# Patient Record
Sex: Male | Born: 1975 | Race: White | Hispanic: No | Marital: Married | Smoking: Never smoker
Health system: Southern US, Community
[De-identification: ages and names within clinical notes are randomized; demographics above are authoritative.]

## PROBLEM LIST (undated history)

## (undated) DIAGNOSIS — E039 Hypothyroidism, unspecified: Secondary | ICD-10-CM

## (undated) DIAGNOSIS — I10 Essential (primary) hypertension: Secondary | ICD-10-CM

## (undated) DIAGNOSIS — A4902 Methicillin resistant Staphylococcus aureus infection, unspecified site: Secondary | ICD-10-CM

## (undated) DIAGNOSIS — F319 Bipolar disorder, unspecified: Secondary | ICD-10-CM

## (undated) DIAGNOSIS — E785 Hyperlipidemia, unspecified: Secondary | ICD-10-CM

## (undated) DIAGNOSIS — K219 Gastro-esophageal reflux disease without esophagitis: Secondary | ICD-10-CM

## (undated) DIAGNOSIS — G43909 Migraine, unspecified, not intractable, without status migrainosus: Secondary | ICD-10-CM

## (undated) HISTORY — DX: Hyperlipidemia, unspecified: E78.5

## (undated) HISTORY — PX: ROTATOR CUFF REPAIR: SHX139

## (undated) HISTORY — DX: Essential (primary) hypertension: I10

## (undated) HISTORY — DX: Hypothyroidism, unspecified: E03.9

## (undated) HISTORY — DX: Gastro-esophageal reflux disease without esophagitis: K21.9

## (undated) HISTORY — PX: SEPTOPLASTY: SUR1290

## (undated) HISTORY — DX: Migraine, unspecified, not intractable, without status migrainosus: G43.909

## (undated) HISTORY — DX: Bipolar disorder, unspecified: F31.9

---

## 1999-10-12 ENCOUNTER — Emergency Department (HOSPITAL_COMMUNITY): Admission: EM | Admit: 1999-10-12 | Discharge: 1999-10-12 | Payer: Self-pay | Admitting: Emergency Medicine

## 2014-10-16 ENCOUNTER — Encounter (INDEPENDENT_AMBULATORY_CARE_PROVIDER_SITE_OTHER): Payer: Self-pay

## 2014-10-16 ENCOUNTER — Encounter: Payer: Self-pay | Admitting: Family Medicine

## 2014-10-16 ENCOUNTER — Ambulatory Visit (INDEPENDENT_AMBULATORY_CARE_PROVIDER_SITE_OTHER): Payer: BC Managed Care – PPO | Admitting: Family Medicine

## 2014-10-16 VITALS — BP 110/70 | HR 70 | Temp 97.9°F | Ht 73.0 in | Wt 244.1 lb

## 2014-10-16 DIAGNOSIS — I10 Essential (primary) hypertension: Secondary | ICD-10-CM

## 2014-10-16 DIAGNOSIS — Z8619 Personal history of other infectious and parasitic diseases: Secondary | ICD-10-CM | POA: Diagnosis not present

## 2014-10-16 DIAGNOSIS — E039 Hypothyroidism, unspecified: Secondary | ICD-10-CM | POA: Diagnosis not present

## 2014-10-16 DIAGNOSIS — Z0000 Encounter for general adult medical examination without abnormal findings: Secondary | ICD-10-CM | POA: Diagnosis not present

## 2014-10-16 DIAGNOSIS — F99 Mental disorder, not otherwise specified: Secondary | ICD-10-CM

## 2014-10-16 NOTE — Patient Instructions (Signed)
It was nice to see you today.  Please let me know if you need any refills on your medications.  Follow up in 6 months or sooner if needed.  Take care  Dr. Adriana Simas

## 2014-10-16 NOTE — Progress Notes (Signed)
Pre visit review using our clinic review tool, if applicable. No additional management support is needed unless otherwise documented below in the visit note. 

## 2014-10-18 ENCOUNTER — Encounter: Payer: Self-pay | Admitting: Family Medicine

## 2014-10-18 DIAGNOSIS — F99 Mental disorder, not otherwise specified: Secondary | ICD-10-CM | POA: Insufficient documentation

## 2014-10-18 DIAGNOSIS — Z8619 Personal history of other infectious and parasitic diseases: Secondary | ICD-10-CM | POA: Insufficient documentation

## 2014-10-18 DIAGNOSIS — Z0000 Encounter for general adult medical examination without abnormal findings: Secondary | ICD-10-CM | POA: Insufficient documentation

## 2014-10-18 DIAGNOSIS — E039 Hypothyroidism, unspecified: Secondary | ICD-10-CM | POA: Insufficient documentation

## 2014-10-18 DIAGNOSIS — I10 Essential (primary) hypertension: Secondary | ICD-10-CM | POA: Insufficient documentation

## 2014-10-18 NOTE — Progress Notes (Signed)
Subjective:  Patient ID: Matthew Griffin, male    DOB: May 17, 1975  Age: 39 y.o. MRN: [id number]  CC: Establish Care   HPI Matthew Griffin is a 39 year old male who presents to establish care. Concerns/issues below.   1) Preventative Healthcare  Colonoscopy: 2011  Immunizations: UTD  Labs: Patient reports he has had recent labs.  STD/HIV testing: Patient reports HIV screening has been done (cannot locate in chart).   2) Recurrent Mononucleosis  Patient reports he had mononucleosis in 2015.  He states that since that time he has had recurrent bouts of fatigue and diffuse bodyaches. He attributes these symptoms to recurrence of EBV.  He states that he currently feels like he is having a recurrence.  He states he has seen an ID physician regarding this.  3) Thrush  Patient reports that after being diagnosed with mono he developed thrush which she says was in his esophagus.  He underwent endoscopy and was treated accordingly with anti-fungus.  Patient currently feels like he has something in his throat.   He currently feels like he is having a recurrence of thrush.  No exacerbating or relieving factors.   4) Bipolar disorder, ? Schizophrenia  Patient states that he is bipolar.  He is followed by psychiatry and is currently on lithium and Klonopin.  Patient's review of systems was notable for significant psychiatric issues including hallucinations and suicidal ideation.  He has one refill he is doing okay at this time on lithium.  I asked him about suicidal ideation and he states that he has never had a plan. He just felt like he would be better off deceased.  I also asked him about hallucinations and he confirms that he hears voices.  He states that the diagnosis of schizophrenia has been discussed but not validated.  PMH, Surgical Hx, Family Hx, Social History reviewed and updated as below. Past Medical History  Diagnosis Date  . Bipolar disorder   . HTN  (hypertension)   . HLD (hyperlipidemia)   . Migraines   . Hypothyroidism   . GERD (gastroesophageal reflux disease)     Past Surgical History  Procedure Laterality Date  . Septoplasty      Family History  Problem Relation Age of Onset  . Heart disease Father   . Heart disease Paternal Grandmother   . Heart disease Paternal Grandfather   . Stroke Maternal Grandmother   . Hypertension Father   . Hypertension Paternal Grandfather   . Hypertension Paternal Grandmother   . Mental illness Paternal Grandmother     Social History  Substance Use Topics  . Smoking status: Never Smoker   . Smokeless tobacco: Never Used  . Alcohol Use: 0.6 - 1.2 oz/week    1-2 Standard drinks or equivalent per week    Review of Systems  Constitutional: Positive for fever, chills and fatigue.  HENT: Positive for sore throat.        Sinus pain.  Respiratory: Positive for shortness of breath.   Cardiovascular: Negative.   Gastrointestinal: Positive for nausea and abdominal pain.  Endocrine:       Increased thirst.  "Enlarged thyroid"  Genitourinary: Negative.   Musculoskeletal: Positive for arthralgias.  Skin: Negative.   Allergic/Immunologic: Positive for environmental allergies.  Neurological: Positive for dizziness and numbness.  Psychiatric/Behavioral: Positive for suicidal ideas, hallucinations and sleep disturbance.  ROS form scanned into chart.   Objective:   Today's Vitals: BP 110/70 mmHg  Pulse 70  Temp(Src) 97.9 F (36.6 C) (  Oral)  Ht  (1.854 m)  Wt 244 lb 2 oz (110.734 kg)  BMI 32.22 kg/m2  SpO2 97%  Physical Exam  Constitutional: He is oriented to person, place, and time. He appears well-developed and well-nourished. No distress.  HENT:  Head: Normocephalic and atraumatic.  Nose: Nose normal.  Mouth/Throat: Oropharynx is clear and moist. No oropharyngeal exudate.  Normal TM's bilaterally.   Eyes: Conjunctivae are normal. No scleral icterus.  Neck: Neck supple. No  thyromegaly present.  Cardiovascular: Normal rate and regular rhythm.   No murmur heard. Pulmonary/Chest: Effort normal and breath sounds normal. He has no wheezes. He has no rales.  Abdominal: Soft. He exhibits no distension. There is no tenderness. There is no rebound and no guarding.  Musculoskeletal: Normal range of motion. He exhibits no edema.  Lymphadenopathy:    He has no cervical adenopathy.  Neurological: He is alert and oriented to person, place, and time.  Skin: Skin is warm and dry. No rash noted.  Psychiatric:  Depressed mood and affect.  Vitals reviewed.    Assessment & Plan:   Problem List Items Addressed This Visit    None      Outpatient Encounter Prescriptions as of 10/16/2014  Medication Sig  . cholecalciferol (VITAMIN D) 400 UNITS TABS tablet Take 400 Units by mouth.  . clonazePAM (KLONOPIN) 0.5 MG tablet Take 0.5 mg by mouth 2 (two) times daily as needed for anxiety.  Marland Kitchen esomeprazole (NEXIUM) 20 MG packet Take 22.3 mg by mouth daily before breakfast.  . fluticasone (FLONASE) 50 MCG/ACT nasal spray Place 2 sprays into both nostrils 2 (two) times daily.  Boris Lown Oil (HM MEGAKRILL) 300 MG CAPS Take 500 mg by mouth.  . levothyroxine (SYNTHROID, LEVOTHROID) 75 MCG tablet Take 75 mcg by mouth daily before breakfast.  . LITHIUM CARBONATE ER PO Take 300 mg by mouth.  . metoprolol succinate (TOPROL-XL) 25 MG 24 hr tablet Take 25 mg by mouth daily.  . Multiple Vitamin (MULTIVITAMIN) capsule Take 1 capsule by mouth daily.  . vitamin B-12 (CYANOCOBALAMIN) 1000 MCG tablet Take 3,000 mcg by mouth daily.   No facility-administered encounter medications on file as of 10/16/2014.    Follow-up: Return in about 6 months (around 04/18/2015).    Tommie Sams DO

## 2014-10-18 NOTE — Assessment & Plan Note (Addendum)
The diagnosis is unclear to me at this time. I favor the diagnosis of schizophrenia as patient is having of auditory hallucinations.  Review of systems was markedly abnormal especially from a psychiatric perspective. I suspect this is all secondary to his underlying mental illness. Patient is currently on lithium and Klonopin. I encouraged him to follow-up closely with his psychiatrist.

## 2014-10-18 NOTE — Assessment & Plan Note (Signed)
I reviewed the electronic medical record and can find no record of patient having esophagitis. Patient has had oral thrush and this was confirmed in the chart. Additionally, I cannot find any record of HIV testing. Will await medical records.

## 2014-10-18 NOTE — Assessment & Plan Note (Signed)
Patient is convinced that he has recurrent mononucleosis. I have reviewed electronic medical record and patient has seen infectious disease for this.  Last office visit from his ID physician revealed that there was no evidence of recurrence. Patient symptoms of fatigue and diffuse body aches as well as his other somatic complaints are likely from underlying mental illness.

## 2014-10-18 NOTE — Assessment & Plan Note (Signed)
Well controlled on Metoprolol

## 2014-10-18 NOTE — Assessment & Plan Note (Signed)
Preventative healthcare up to date excluding labs. Will await records.

## 2014-12-03 ENCOUNTER — Ambulatory Visit (INDEPENDENT_AMBULATORY_CARE_PROVIDER_SITE_OTHER): Payer: BC Managed Care – PPO | Admitting: Family Medicine

## 2014-12-03 ENCOUNTER — Encounter: Payer: Self-pay | Admitting: Family Medicine

## 2014-12-03 VITALS — BP 124/86 | HR 60 | Temp 98.0°F | Ht 73.0 in | Wt 233.0 lb

## 2014-12-03 DIAGNOSIS — M25512 Pain in left shoulder: Secondary | ICD-10-CM | POA: Diagnosis not present

## 2014-12-03 DIAGNOSIS — R5382 Chronic fatigue, unspecified: Secondary | ICD-10-CM

## 2014-12-03 DIAGNOSIS — M75102 Unspecified rotator cuff tear or rupture of left shoulder, not specified as traumatic: Secondary | ICD-10-CM | POA: Diagnosis not present

## 2014-12-03 MED ORDER — METHYLPREDNISOLONE ACETATE 40 MG/ML IJ SUSP: 40.0000 mg | Freq: Once | INTRAMUSCULAR | Status: DC

## 2014-12-03 NOTE — Patient Instructions (Signed)
It was nice to see you today.  Be sure to move that shoulder as we discussed.  We will call regarding your labs.  Take care  Dr. Adriana Simas

## 2014-12-03 NOTE — Progress Notes (Signed)
Pre visit review using our clinic review tool, if applicable. No additional management support is needed unless otherwise documented below in the visit note. 

## 2014-12-04 ENCOUNTER — Ambulatory Visit: Payer: BC Managed Care – PPO | Admitting: Family Medicine

## 2014-12-04 DIAGNOSIS — R5382 Chronic fatigue, unspecified: Secondary | ICD-10-CM | POA: Insufficient documentation

## 2014-12-04 DIAGNOSIS — M25512 Pain in left shoulder: Secondary | ICD-10-CM | POA: Insufficient documentation

## 2014-12-04 LAB — EPSTEIN-BARR VIRUS VCA, IGM

## 2014-12-04 NOTE — Progress Notes (Signed)
Subjective:  Patient ID: Matthew Griffin, male    DOB: [birth year]  Age: 39 y.o. MRN: [id number]  CC: Left shoulder pain, Fatigue  HPI: 39 year old male with ?bipolar disorder vs schizophrenia presents to the clinic today with the above complaints.  1) Left shoulder pain  Started 3-4 weeks ago following vacation where he went tubing.  He reports that he was "jerked around" and subsequently developed left shoulder pain.  Pain is located laterally.  He has been expressing decreased range of motion particularly with flexion and abduction.  He's used topical treatment as well as Advil with minimal improvement.  Pain is exacerbated with range of motion.  2) Fatigue  Patient reports for the past few weeks to months has been experiencing significant fatigue.  He states that he feels as if he has recurrence of his mononucleosis.  No associated fevers or chills. No recent illness.  He has had recent medication changes by his psychiatrist (lithium was increased and Latuda was started).  Social Hx   Social History   Social History  . Marital Status: Single    Spouse Name: N/A  . Number of Children: N/A  . Years of Education: N/A   Social History Main Topics  . Smoking status: Never Smoker   . Smokeless tobacco: Never Used  . Alcohol Use: 0.6 - 1.2 oz/week    1-2 Standard drinks or equivalent per week  . Drug Use: No  . Sexual Activity:    Partners: Female   Other Topics Concern  . None   Social History Narrative   Review of Systems  Constitutional: Positive for fatigue.  Musculoskeletal:       Left shoulder pain.   Objective:  BP 124/86 mmHg  Pulse 60  Temp(Src) 98 F (36.7 C) (Oral)  Ht  (1.854 m)  Wt 233 lb (105.688 kg)  BMI 30.75 kg/m2  SpO2 98%  BP/Weight 12/03/2014 10/16/2014  Systolic BP 124 110  Diastolic BP 86 70  Wt. (Lbs) 233 244.13  BMI 30.75 32.22   Physical Exam  Constitutional: He appears well-developed and well-nourished.    Cardiovascular: Normal rate and regular rhythm.   No murmur heard. Pulmonary/Chest: Effort normal and breath sounds normal. No respiratory distress. He has no wheezes. He has no rales.  Musculoskeletal:  Shoulder: Left Inspection reveals no abnormalities, atrophy or asymmetry. Palpation is normal with no tenderness over AC joint or bicipital groove. ROM is decreased in flexion. Rotator cuff strength: Supraspinatus 4/5; all others 5/5. +Hawkins test. Painful arc.  Neurological: He is alert.  Psychiatric:  Depressed mood and flat affect.  Vitals reviewed.  Procedure: Subacromial bursa injection. Consent signed and scanned into record. Medication:  1 mL of Depo-Medrol and 4 mL's of lidocaine 1% without epi Preparation: area cleansed with alcohol x 3. Injection  Landmarks identified Above medication injected using a standard posterior approach. Patient tolerated well without bleeding or paresthesias   Assessment & Plan:   Problem List Items Addressed This Visit    Left shoulder pain    Appears to be secondary to rotator cuff injury/bursitis. Discussed therapy options today and patient elected for injection. Patient tolerated injection well. Advised wall exercise at home. Follow up PRN.      Relevant Medications   methylPREDNISolone acetate (DEPO-MEDROL) injection 40 mg   Chronic fatigue - Primary    Patient is perseverative about recurring mononucleosis. He has seen infectious disease and has had no signs of recurrence despite his belief that this is  the case. I informed him that I do not think he's had a recurrence as this is very atypical and he said prior labs which have not reflected this. Nevertheless, we will obtain labs to ensure no recurrence. I anticipate that his fatigue is secondary to stress, mental illness, and medications.       Relevant Orders   Epstein barr virus(EBV) by PCR   Epstein-Barr virus VCA, IgM    Other Visit Diagnoses    Rotator cuff  syndrome, left          Meds ordered this encounter  Medications  . B Complex Vitamins (VITAMIN B COMPLEX PO)    Sig: Take 1,000 mg by mouth.  . lurasidone (LATUDA) 80 MG TABS tablet    Sig: Take 80 mg by mouth daily with breakfast.  . methylPREDNISolone acetate (DEPO-MEDROL) injection 40 mg    Sig:     Follow-up: PRN  Everlene Other, DO

## 2014-12-04 NOTE — Assessment & Plan Note (Signed)
Appears to be secondary to rotator cuff injury/bursitis. Discussed therapy options today and patient elected for injection. Patient tolerated injection well. Advised wall exercise at home. Follow up PRN.

## 2014-12-04 NOTE — Assessment & Plan Note (Signed)
Patient is perseverative about recurring mononucleosis. He has seen infectious disease and has had no signs of recurrence despite his belief that this is the case. I informed him that I do not think he's had a recurrence as this is very atypical and he said prior labs which have not reflected this. Nevertheless, we will obtain labs to ensure no recurrence. I anticipate that his fatigue is secondary to stress, mental illness, and medications.

## 2014-12-08 LAB — EPSTEIN BARR VIRUS DNA, QUANT RTPCR

## 2014-12-15 ENCOUNTER — Other Ambulatory Visit: Payer: Self-pay | Admitting: Family Medicine

## 2015-01-09 ENCOUNTER — Other Ambulatory Visit: Payer: Self-pay | Admitting: Family Medicine

## 2015-01-11 ENCOUNTER — Other Ambulatory Visit: Payer: Self-pay

## 2015-01-11 NOTE — Telephone Encounter (Signed)
Patient is needing a 90 day refill on his Nexium 20 mg.

## 2015-01-14 ENCOUNTER — Telehealth: Payer: Self-pay | Admitting: Internal Medicine

## 2015-01-14 NOTE — Telephone Encounter (Signed)
Ok with me 

## 2015-01-14 NOTE — Telephone Encounter (Signed)
Transfer okay with me.

## 2015-01-14 NOTE — Telephone Encounter (Signed)
Pt request to transfer from Dr. Adriana Simasook to Dr. Lawerance BachBurns to be closer to work area. Please advise  Call Amber back [phone]

## 2015-01-15 NOTE — Telephone Encounter (Signed)
Left vm for pt to call and schedule an appt with Dr. Lawerance BachBurns.

## 2015-02-16 ENCOUNTER — Ambulatory Visit: Payer: BC Managed Care – PPO | Admitting: Internal Medicine

## 2015-02-19 ENCOUNTER — Ambulatory Visit (INDEPENDENT_AMBULATORY_CARE_PROVIDER_SITE_OTHER): Payer: BC Managed Care – PPO | Admitting: Internal Medicine

## 2015-02-19 ENCOUNTER — Encounter: Payer: Self-pay | Admitting: Internal Medicine

## 2015-02-19 VITALS — BP 110/82 | HR 65 | Temp 98.1°F | Resp 16 | Wt 239.0 lb

## 2015-02-19 DIAGNOSIS — E039 Hypothyroidism, unspecified: Secondary | ICD-10-CM

## 2015-02-19 DIAGNOSIS — I10 Essential (primary) hypertension: Secondary | ICD-10-CM

## 2015-02-19 DIAGNOSIS — M25512 Pain in left shoulder: Secondary | ICD-10-CM | POA: Diagnosis not present

## 2015-02-19 DIAGNOSIS — F99 Mental disorder, not otherwise specified: Secondary | ICD-10-CM | POA: Diagnosis not present

## 2015-02-19 NOTE — Assessment & Plan Note (Signed)
Refer to orthopedics 

## 2015-02-19 NOTE — Progress Notes (Signed)
Pre visit review using our clinic review tool, if applicable. No additional management support is needed unless otherwise documented below in the visit note. 

## 2015-02-19 NOTE — Assessment & Plan Note (Signed)
?   Bipolar or schizophrenia Following with psychiatry He was in favor of me prescribing his medication, I told him I would not feel comfortable doing that and he would need to continue to see a psychiatrist He may decide to switch to a psychiatrist closer to where he lives-he will let me know if he needs a referral

## 2015-02-19 NOTE — Progress Notes (Signed)
Subjective:    Patient ID: Matthew Griffin, male    DOB: September 05, 1975, 39 y.o.   MRN: [id number]  HPI He is here to establish with a new pcp.   Left shoulder pain: For over a month he has been experiencing left shoulder pain. He saw his previous primary care physician and he thought it may have been a nerve problem  Per the patient. He was given an injection, but it did not help. He is doing the stretching exercises as advised, but there still has been no improvement. He would like to see an orthopedic. He states the injury occurred when he was tubing-being pulled by a boat and his arm was tugged hardly. He has difficulty putting weight on his arm and it is difficult for him to raise his arm above his head.   Hypothyroidism: He is following with a psychiatrist and she has been monitoring His thyroid function and prescribing his medication. He states he just had blood work done this morning.  Hypertension: He is taking his medication daily. He is compliant with a low sodium diet.  He  does experience chest pain, palpitations and shortness of breath with anxiety only, but denies these symptoms any other time. He denies lower extremity edema and headaches. He is not exercising regularly.  He does not monitor his blood pressure at home.       Medications and allergies reviewed with patient and updated if appropriate.  Patient Active Problem List   Diagnosis Date Noted  . Left shoulder pain 12/04/2014  . Chronic fatigue 12/04/2014  . Chronic mental illness 10/18/2014  . Preventative health care 10/18/2014  . History of thrush 10/18/2014  . Hypothyroidism 10/18/2014  . History of mononucleosis 10/18/2014  . Essential hypertension 10/18/2014    Current Outpatient Prescriptions on File Prior to Visit  Medication Sig Dispense Refill  . B Complex Vitamins (VITAMIN B COMPLEX PO) Take 1,000 mg by mouth.    . cholecalciferol (VITAMIN D) 400 UNITS TABS tablet Take 400 Units by mouth.    .  clonazePAM (KLONOPIN) 0.5 MG tablet Take 0.25 mg by mouth daily. In the morning    . esomeprazole (NEXIUM) 20 MG packet Take 22.3 mg by mouth daily before breakfast.    . fluticasone (FLONASE) 50 MCG/ACT nasal spray Place 2 sprays into both nostrils 2 (two) times daily.    Boris Lown Oil (HM MEGAKRILL) 300 MG CAPS Take 500 mg by mouth.    . levothyroxine (SYNTHROID, LEVOTHROID) 75 MCG tablet Take 75 mcg by mouth daily before breakfast.    . LITHIUM CARBONATE ER PO Take 300 mg by mouth. 2 tablets in the morning, 3 tablets at night    . metoprolol succinate (TOPROL-XL) 25 MG 24 hr tablet Take 25 mg by mouth daily.    . Multiple Vitamin (MULTIVITAMIN) capsule Take 1 capsule by mouth daily.    . vitamin B-12 (CYANOCOBALAMIN) 1000 MCG tablet Take 3,000 mcg by mouth daily.     Current Facility-Administered Medications on File Prior to Visit  Medication Dose Route Frequency Provider Last Rate Last Dose  . methylPREDNISolone acetate (DEPO-MEDROL) injection 40 mg  40 mg Intra-articular Once Tommie Sams, DO        Past Medical History  Diagnosis Date  . Bipolar disorder (HCC)   . HTN (hypertension)   . HLD (hyperlipidemia)   . Migraines   . Hypothyroidism   . GERD (gastroesophageal reflux disease)     Past Surgical History  Procedure  Laterality Date  . Septoplasty      Social History   Social History  . Marital Status: Single    Spouse Name: N/A  . Number of Children: N/A  . Years of Education: N/A   Social History Main Topics  . Smoking status: Never Smoker   . Smokeless tobacco: Never Used  . Alcohol Use: 0.6 - 1.2 oz/week    1-2 Standard drinks or equivalent per week  . Drug Use: No  . Sexual Activity:    Partners: Female   Other Topics Concern  . None   Social History Narrative    Review of Systems  Constitutional: Negative for fever and chills.  Respiratory: Positive for shortness of breath (with panic attacks only). Negative for cough and wheezing.   Cardiovascular:  Positive for chest pain (with panic attacks only) and palpitations (with panic attacks only). Negative for leg swelling.  Neurological: Positive for headaches (occasional). Negative for dizziness and light-headedness.  Psychiatric/Behavioral: Positive for sleep disturbance (controlled with ativan). The patient is nervous/anxious.        Objective:   Filed Vitals:   02/19/15 0833  BP: 110/82  Pulse: 65  Temp: 98.1 F (36.7 C)  Resp: 16   Filed Weights   02/19/15 0833  Weight: 239 lb (108.41 kg)   Body mass index is 31.54 kg/(m^2).   Physical Exam Constitutional: Appears well-developed and well-nourished. No distress.  Neck: Neck supple. No tracheal deviation present. No thyromegaly present.  No carotid bruit. No cervical adenopathy.   Cardiovascular: Normal rate, regular rhythm and normal heart sounds.   No murmur heard. Pulmonary/Chest: Effort normal and breath sounds normal. No respiratory distress. No wheezes.  Musculoskeletal: No edema.       Assessment & Plan:   See Problem List.  Follow up annually

## 2015-02-19 NOTE — Assessment & Plan Note (Signed)
BP Readings from Last 3 Encounters:  02/19/15 110/82  12/03/14 124/86  10/16/14 110/70   Blood pressure well-controlled Continue current dose of metoprolol Encouraged regular exercise

## 2015-02-19 NOTE — Patient Instructions (Addendum)
  We have reviewed your prior records including labs and tests today.  All other Health Maintenance issues reviewed.   All recommended immunizations and age-appropriate screenings are up-to-date.  No immunizations administered today.   Medications reviewed and updated.  No changes recommended at this time.  A referral was ordered for orthopedics.    Please followup annually.

## 2015-02-19 NOTE — Assessment & Plan Note (Signed)
He had blood work done today-his psychiatrist has been monitoring Advised him he can continue to have her monitor it or I can monitor it-whatever is easiest

## 2015-03-10 ENCOUNTER — Encounter: Payer: Self-pay | Admitting: Internal Medicine

## 2015-03-10 DIAGNOSIS — F411 Generalized anxiety disorder: Secondary | ICD-10-CM | POA: Insufficient documentation

## 2015-03-10 DIAGNOSIS — F3162 Bipolar disorder, current episode mixed, moderate: Secondary | ICD-10-CM | POA: Insufficient documentation

## 2015-06-29 ENCOUNTER — Other Ambulatory Visit: Payer: Self-pay | Admitting: Orthopedic Surgery

## 2015-06-29 DIAGNOSIS — M25512 Pain in left shoulder: Secondary | ICD-10-CM

## 2015-07-03 ENCOUNTER — Other Ambulatory Visit: Payer: BC Managed Care – PPO

## 2015-07-04 ENCOUNTER — Ambulatory Visit
Admission: RE | Admit: 2015-07-04 | Discharge: 2015-07-04 | Disposition: A | Discharge status: Home / Self Care | Payer: BC Managed Care – PPO | Source: Ambulatory Visit | Attending: Orthopedic Surgery | Admitting: Orthopedic Surgery

## 2015-07-04 DIAGNOSIS — M25512 Pain in left shoulder: Secondary | ICD-10-CM

## 2015-07-27 ENCOUNTER — Other Ambulatory Visit: Payer: Self-pay | Admitting: *Deleted

## 2015-07-27 MED ORDER — FLUTICASONE PROPIONATE 50 MCG/ACT NA SUSP
2.0000 | Freq: Two times a day (BID) | NASAL | Status: DC
Start: 2015-07-27 — End: 2015-11-22

## 2015-07-27 NOTE — Telephone Encounter (Signed)
Receive call pt states it is cheaper to have rx call in on his flonase instead of purchasing over the counter. Requesting rx yo be sent for flonase. Verified pharmacy inform will send to [facility]...Raechel Chute/lmb

## 2015-09-22 ENCOUNTER — Ambulatory Visit (INDEPENDENT_AMBULATORY_CARE_PROVIDER_SITE_OTHER): Payer: BC Managed Care – PPO | Admitting: Internal Medicine

## 2015-09-22 ENCOUNTER — Encounter: Payer: Self-pay | Admitting: Internal Medicine

## 2015-09-22 DIAGNOSIS — I10 Essential (primary) hypertension: Secondary | ICD-10-CM

## 2015-09-22 DIAGNOSIS — R062 Wheezing: Secondary | ICD-10-CM | POA: Diagnosis not present

## 2015-09-22 DIAGNOSIS — R059 Cough, unspecified: Secondary | ICD-10-CM

## 2015-09-22 DIAGNOSIS — R05 Cough: Secondary | ICD-10-CM

## 2015-09-22 MED ORDER — ALBUTEROL SULFATE HFA 108 (90 BASE) MCG/ACT IN AERS: 2.0000 | INHALATION_SPRAY | Freq: Four times a day (QID) | RESPIRATORY_TRACT | Status: AC | PRN

## 2015-09-22 MED ORDER — AZITHROMYCIN 250 MG PO TABS: ORAL_TABLET | ORAL | Status: DC

## 2015-09-22 MED ORDER — HYDROCODONE-HOMATROPINE 5-1.5 MG/5ML PO SYRP: 5.0000 mL | ORAL_SOLUTION | Freq: Four times a day (QID) | ORAL | Status: DC | PRN

## 2015-09-22 NOTE — Progress Notes (Signed)
Pre visit review using our clinic review tool, if applicable. No additional management support is needed unless otherwise documented below in the visit note. 

## 2015-09-22 NOTE — Progress Notes (Signed)
Subjective:    Patient ID: Matthew Griffin, male    DOB: 09/29/1975, 40 y.o.   MRN: [id number]  HPI   Here with acute onset mild to mod 2-3 days ST, HA, general weakness and malaise, with prod cough greenish sputum, but Pt denies chest pain, increased sob or doe, wheezing, orthopnea, PND, increased LE swelling, palpitations, dizziness or syncope, except for onset mild wheezing last PM with mild sob Past Medical History:  Diagnosis Date  . Bipolar disorder (HCC)   . GERD (gastroesophageal reflux disease)   . HLD (hyperlipidemia)   . HTN (hypertension)   . Hypothyroidism   . Migraines    Past Surgical History:  Procedure Laterality Date  . SEPTOPLASTY      reports that he has never smoked. He has never used smokeless tobacco. He reports that he drinks about 0.6 - 1.2 oz of alcohol per week . He reports that he does not use drugs. family history includes Heart disease in his father, paternal grandfather, and paternal grandmother; Hypertension in his father, paternal grandfather, and paternal grandmother; Mental illness in his paternal grandmother; Stroke in his maternal grandmother. Allergies  Allergen Reactions  . Dust Mite Extract Other (See Comments)    Sinus issues   . Molds & Smuts Other (See Comments)    Sinus issues   Current Outpatient Prescriptions on File Prior to Visit  Medication Sig Dispense Refill  . B Complex Vitamins (VITAMIN B COMPLEX PO) Take 1,000 mg by mouth.    . cholecalciferol (VITAMIN D) 400 UNITS TABS tablet Take 400 Units by mouth.    . clonazePAM (KLONOPIN) 0.5 MG tablet Take 0.25 mg by mouth daily. In the morning    . esomeprazole (NEXIUM) 20 MG packet Take 22.3 mg by mouth daily before breakfast.    . fluticasone (FLONASE) 50 MCG/ACT nasal spray Place 2 sprays into both nostrils 2 (two) times daily. 16 g 3  . Krill Oil (HM MEGAKRILL) 300 MG CAPS Take 500 mg by mouth.    . levothyroxine (SYNTHROID, LEVOTHROID) 75 MCG tablet Take 75 mcg by mouth daily before  breakfast.    . LITHIUM CARBONATE ER PO Take 300 mg by mouth. 2 tablets in the morning, 3 tablets at night    . LORazepam (ATIVAN) 0.5 MG tablet Take 1 mg by mouth at bedtime.    Marland Kitchen. lurasidone (LATUDA) 40 MG TABS tablet Take 60 mg by mouth at bedtime.    . metoprolol succinate (TOPROL-XL) 25 MG 24 hr tablet Take 25 mg by mouth daily.    . Multiple Vitamin (MULTIVITAMIN) capsule Take 1 capsule by mouth daily.    . vitamin B-12 (CYANOCOBALAMIN) 1000 MCG tablet Take 3,000 mcg by mouth daily.     Current Facility-Administered Medications on File Prior to Visit  Medication Dose Route Frequency Provider Last Rate Last Dose  . methylPREDNISolone acetate (DEPO-MEDROL) injection 40 mg  40 mg Intra-articular Once Tommie SamsJayce G Cook, DO       Review of Systems  Constitutional: Negative for unusual diaphoresis or night sweats HENT: Negative for ear swelling or discharge Eyes: Negative for worsening visual haziness  Respiratory: Negative for choking and stridor.   Gastrointestinal: Negative for distension or worsening eructation Genitourinary: Negative for retention or change in urine volume.  Musculoskeletal: Negative for other MSK pain or swelling Skin: Negative for color change and worsening wound Neurological: Negative for tremors and numbness other than noted  Psychiatric/Behavioral: Negative for decreased concentration or agitation other than above  Objective:   Physical Exam BP 120/82   Pulse 80   Temp 98 F (36.7 C) (Oral)   Resp 20   Wt 220 lb (99.8 kg)   SpO2 98%   BMI 29.03 kg/m   VS noted, mild ill Constitutional: Pt appears in no apparent distress HENT: Head: NCAT.  Right Ear: External ear normal.  Left Ear: External ear normal.  Eyes: . Pupils are equal, round, and reactive to light. Conjunctivae and EOM are normal Neck: Normal range of motion. Neck supple.  Cardiovascular: Normal rate and regular rhythm.   Pulmonary/Chest: Effort normal and breath sounds without rales  but with mild bilat scattered wheezing.  Neurological: Pt is alert. Not confused , motor grossly intact Skin: Skin is warm. No rash, no LE edema Psychiatric: Pt behavior is normal. No agitation.      Assessment & Plan:

## 2015-09-22 NOTE — Patient Instructions (Signed)
Please take all new medication as prescribed - the antibiotic, cough medicine and inhaler if needed  Please continue all other medications as before, and refills have been done if requested.  Please have the pharmacy call with any other refills you may need.  Please keep your appointments with your specialists as you may have planned

## 2015-09-26 DIAGNOSIS — R05 Cough: Secondary | ICD-10-CM | POA: Insufficient documentation

## 2015-09-26 DIAGNOSIS — R0600 Dyspnea, unspecified: Secondary | ICD-10-CM | POA: Insufficient documentation

## 2015-09-26 DIAGNOSIS — R059 Cough, unspecified: Secondary | ICD-10-CM | POA: Insufficient documentation

## 2015-09-26 NOTE — Assessment & Plan Note (Signed)
stable overall by history and exam, recent data reviewed with pt, and pt to continue medical treatment as before,  to f/u any worsening symptoms or concerns BP Readings from Last 3 Encounters:  09/22/15 120/82  02/19/15 110/82  12/03/14 124/86

## 2015-09-26 NOTE — Assessment & Plan Note (Addendum)
Mild, for predpac asd, inhaler prn,  to f/u any worsening symptoms or concerns 

## 2015-09-26 NOTE — Assessment & Plan Note (Signed)
Mild, c/w bronchitis vs pna, declines cxr, for antibx course,  to f/u any worsening symptoms or concerns

## 2015-10-05 ENCOUNTER — Ambulatory Visit (INDEPENDENT_AMBULATORY_CARE_PROVIDER_SITE_OTHER): Payer: BC Managed Care – PPO | Admitting: Internal Medicine

## 2015-10-05 ENCOUNTER — Encounter: Payer: Self-pay | Admitting: Internal Medicine

## 2015-10-05 DIAGNOSIS — R059 Cough, unspecified: Secondary | ICD-10-CM

## 2015-10-05 DIAGNOSIS — R05 Cough: Secondary | ICD-10-CM | POA: Diagnosis not present

## 2015-10-05 DIAGNOSIS — R0600 Dyspnea, unspecified: Secondary | ICD-10-CM

## 2015-10-05 DIAGNOSIS — I10 Essential (primary) hypertension: Secondary | ICD-10-CM

## 2015-10-05 MED ORDER — PREDNISONE 10 MG PO TABS: ORAL_TABLET | ORAL | 0 refills | Status: DC

## 2015-10-05 MED ORDER — LEVOFLOXACIN 500 MG PO TABS: 500.0000 mg | ORAL_TABLET | Freq: Every day | ORAL | 0 refills | Status: AC

## 2015-10-05 MED ORDER — HYDROCODONE-HOMATROPINE 5-1.5 MG/5ML PO SYRP: 5.0000 mL | ORAL_SOLUTION | Freq: Four times a day (QID) | ORAL | 0 refills | Status: DC | PRN

## 2015-10-05 NOTE — Progress Notes (Signed)
Pre visit review using our clinic review tool, if applicable. No additional management support is needed unless otherwise documented below in the visit note. 

## 2015-10-05 NOTE — Patient Instructions (Addendum)
You had the steroid shot today  Please take all new medication as prescribed - the antibiotic, cough med as needed, and prednisone  Please continue all other medications as before, including the inhaler as needed  Please have the pharmacy call with any other refills you may need.  Please keep your appointments with your specialists as you may have planned  Please go to the XRAY Department in the Basement (go straight as you get off the elevator) for the x-ray testing tomorrow or when you can  Please go to the LAB in the Basement (turn left off the elevator) for the tests to be done tomorrow or when you can  You will be contacted by phone if any changes need to be made immediately.  Otherwise, you will receive a letter about your results with an explanation, but please check with MyChart first.  Please remember to sign up for MyChart if you have not done so, as this will be important to you in the future with finding out test results, communicating by private email, and scheduling acute appointments online when needed.

## 2015-10-05 NOTE — Assessment & Plan Note (Signed)
stable overall by history and exam, recent data reviewed with pt, and pt to continue medical treatment as before,  to f/u any worsening symptoms or concerns BP Readings from Last 3 Encounters:  10/05/15 136/80  09/22/15 120/82  02/19/15 110/82

## 2015-10-05 NOTE — Assessment & Plan Note (Signed)
Etiology unclear, c/w uri/?bronchitis/?pna, for cxr, antibx, cough med prn,  to f/u any worsening symptoms or concerns

## 2015-10-05 NOTE — Assessment & Plan Note (Signed)
To cont inhaler, also for depomedrol IM, predpac asd,  to f/u any worsening symptoms or concerns

## 2015-10-05 NOTE — Progress Notes (Signed)
Subjective:    Patient ID: Matthew Griffin, male    DOB: [birth year], 40 y.o.   MRN: [id number]  HPI  Here after being seen last wk, was better, then worse again.  Here with 2-3 days acute onset prod cough yellow green, fever, facial pain, pressure, headache, general weakness and malaise, and greenish d/c, with mild ST, but pt denies chest pain, wheezing, increased sob or doe, orthopnea, PND, increased LE swelling, palpitations, dizziness or syncope, except for mild tightness/sob/doe in the past day. Did finish zpack. Inhaler so far helps minimally.Pt denies new neurological symptoms such as new headache, or facial or extremity weakness or numbness   Pt denies polydipsia, polyuria, . Has lithium level per psychiatry Past Medical History:  Diagnosis Date  . Bipolar disorder (HCC)   . GERD (gastroesophageal reflux disease)   . HLD (hyperlipidemia)   . HTN (hypertension)   . Hypothyroidism   . Migraines    Past Surgical History:  Procedure Laterality Date  . SEPTOPLASTY      reports that he has never smoked. He has never used smokeless tobacco. He reports that he drinks about 0.6 - 1.2 oz of alcohol per week . He reports that he does not use drugs. family history includes Heart disease in his father, paternal grandfather, and paternal grandmother; Hypertension in his father, paternal grandfather, and paternal grandmother; Mental illness in his paternal grandmother; Stroke in his maternal grandmother. Allergies  Allergen Reactions  . Dust Mite Extract Other (See Comments)    Sinus issues   . Molds & Smuts Other (See Comments)    Sinus issues   Current Outpatient Prescriptions on File Prior to Visit  Medication Sig Dispense Refill  . albuterol (PROVENTIL HFA;VENTOLIN HFA) 108 (90 Base) MCG/ACT inhaler Inhale 2 puffs into the lungs every 6 (six) hours as needed for wheezing or shortness of breath. 1 Inhaler 1  . B Complex Vitamins (VITAMIN B COMPLEX PO) Take 1,000 mg by mouth.    .  cholecalciferol (VITAMIN D) 400 UNITS TABS tablet Take 400 Units by mouth.    . clonazePAM (KLONOPIN) 0.5 MG tablet Take 0.25 mg by mouth daily. In the morning    . esomeprazole (NEXIUM) 20 MG packet Take 22.3 mg by mouth daily before breakfast.    . fluticasone (FLONASE) 50 MCG/ACT nasal spray Place 2 sprays into both nostrils 2 (two) times daily. 16 g 3  . Krill Oil (HM MEGAKRILL) 300 MG CAPS Take 500 mg by mouth.    . levothyroxine (SYNTHROID, LEVOTHROID) 75 MCG tablet Take 75 mcg by mouth daily before breakfast.    . LITHIUM CARBONATE ER PO Take 300 mg by mouth. 2 tablets in the morning, 3 tablets at night    . LORazepam (ATIVAN) 0.5 MG tablet Take 1 mg by mouth at bedtime.    Marland Kitchen lurasidone (LATUDA) 40 MG TABS tablet Take 60 mg by mouth at bedtime.    . metoprolol succinate (TOPROL-XL) 25 MG 24 hr tablet Take 25 mg by mouth daily.    . Multiple Vitamin (MULTIVITAMIN) capsule Take 1 capsule by mouth daily.    . vitamin B-12 (CYANOCOBALAMIN) 1000 MCG tablet Take 3,000 mcg by mouth daily.     No current facility-administered medications on file prior to visit.     Review of Systems  Constitutional: Negative for unusual diaphoresis or night sweats HENT: Negative for ear swelling or discharge Eyes: Negative for worsening visual haziness  Respiratory: Negative for choking and stridor.   Gastrointestinal: Negative  for distension or worsening eructation Genitourinary: Negative for retention or change in urine volume.  Musculoskeletal: Negative for other MSK pain or swelling Skin: Negative for color change and worsening wound Neurological: Negative for tremors and numbness other than noted  Psychiatric/Behavioral: Negative for decreased concentration or agitation other than above       Objective:   Physical Exam BP 136/80   Pulse 74   Temp 97.8 F (36.6 C) (Oral)   Resp 20   Wt 224 lb (101.6 kg)   SpO2 98%   BMI 29.55 kg/m  VS noted, mildill Constitutional: Pt appears in no  apparent distress HENT: Head: NCAT.  Right Ear: External ear normal.  Left Ear: External ear normal.  Bilat tm's with mild erythema.  Max sinus areas mild tender.  Pharynx with mild erythema, no exudate Eyes: . Pupils are equal, round, and reactive to light. Conjunctivae and EOM are normal Neck: Normal range of motion. Neck supple.  Cardiovascular: Normal rate and regular rhythm.   Pulmonary/Chest: Effort normal and breath sounds decreased without rales or wheezing.  Abd:  Soft, NT, ND, + BS Neurological: Pt is alert. Not confused , motor grossly intact Skin: Skin is warm. No rash, no LE edema Psychiatric: Pt behavior is normal. No agitation. 1+ nervous    Assessment & Plan:

## 2015-10-06 DIAGNOSIS — R05 Cough: Secondary | ICD-10-CM | POA: Diagnosis not present

## 2015-10-06 MED ORDER — METHYLPREDNISOLONE ACETATE 80 MG/ML IJ SUSP
80.0000 mg | Freq: Once | INTRAMUSCULAR | Status: AC
  Administered 2015-10-06: 80 mg via INTRAMUSCULAR

## 2015-10-06 NOTE — Addendum Note (Signed)
Addended by: Etheleen Mayhew C on: 10/06/2015 08:04 AM   Modules accepted: Orders

## 2015-10-11 ENCOUNTER — Ambulatory Visit (INDEPENDENT_AMBULATORY_CARE_PROVIDER_SITE_OTHER)
Admission: RE | Admit: 2015-10-11 | Discharge: 2015-10-11 | Disposition: A | Discharge status: Home / Self Care | Payer: BC Managed Care – PPO | Source: Ambulatory Visit | Attending: Internal Medicine | Admitting: Internal Medicine

## 2015-10-11 ENCOUNTER — Other Ambulatory Visit (INDEPENDENT_AMBULATORY_CARE_PROVIDER_SITE_OTHER): Payer: BC Managed Care – PPO

## 2015-10-11 DIAGNOSIS — R059 Cough, unspecified: Secondary | ICD-10-CM

## 2015-10-11 DIAGNOSIS — R05 Cough: Secondary | ICD-10-CM

## 2015-10-11 DIAGNOSIS — R0600 Dyspnea, unspecified: Secondary | ICD-10-CM | POA: Diagnosis not present

## 2015-10-11 LAB — CBC WITH DIFFERENTIAL/PLATELET
BASOS ABS: 0 10*3/uL (ref 0.0–0.1)
Basophils Relative: 0.3 % (ref 0.0–3.0)
EOS ABS: 0 10*3/uL (ref 0.0–0.7)
Eosinophils Relative: 0.4 % (ref 0.0–5.0)
HEMATOCRIT: 45.4 % (ref 39.0–52.0)
HEMOGLOBIN: 15.5 g/dL (ref 13.0–17.0)
LYMPHS PCT: 8.8 % — AB (ref 12.0–46.0)
Lymphs Abs: 1.1 10*3/uL (ref 0.7–4.0)
MCHC: 34.2 g/dL (ref 30.0–36.0)
MCV: 89.2 fl (ref 78.0–100.0)
MONO ABS: 0.5 10*3/uL (ref 0.1–1.0)
Monocytes Relative: 4.1 % (ref 3.0–12.0)
Neutro Abs: 11.2 10*3/uL — ABNORMAL HIGH (ref 1.4–7.7)
Neutrophils Relative %: 86.4 % — ABNORMAL HIGH (ref 43.0–77.0)
PLATELETS: 250 10*3/uL (ref 150.0–400.0)
RBC: 5.09 Mil/uL (ref 4.22–5.81)
RDW: 12.6 % (ref 11.5–15.5)
WBC: 12.9 10*3/uL — AB (ref 4.0–10.5)

## 2015-10-11 LAB — BASIC METABOLIC PANEL
BUN: 13 mg/dL (ref 6–23)
CALCIUM: 10.3 mg/dL (ref 8.4–10.5)
CO2: 28 mEq/L (ref 19–32)
CREATININE: 1.38 mg/dL (ref 0.40–1.50)
Chloride: 101 mEq/L (ref 96–112)
GFR: 61 mL/min (ref >60.00)
Glucose, Bld: 150 mg/dL — ABNORMAL HIGH (ref 70–99)
Potassium: 4.2 mEq/L (ref 3.5–5.1)
Sodium: 138 mEq/L (ref 135–145)

## 2015-10-11 LAB — HEPATIC FUNCTION PANEL
ALT: 19 U/L (ref 0–53)
AST: 8 U/L (ref 0–37)
Albumin: 4.7 g/dL (ref 3.5–5.2)
Alkaline Phosphatase: 53 U/L (ref 39–117)
BILIRUBIN DIRECT: 0.2 mg/dL (ref 0.0–0.3)
BILIRUBIN TOTAL: 0.9 mg/dL (ref 0.2–1.2)
Total Protein: 7.1 g/dL (ref 6.0–8.3)

## 2015-10-12 LAB — TSH: TSH: 0.63 u[IU]/mL (ref 0.35–4.50)

## 2015-10-13 ENCOUNTER — Telehealth: Payer: Self-pay

## 2015-10-13 DIAGNOSIS — E109 Type 1 diabetes mellitus without complications: Secondary | ICD-10-CM

## 2015-10-13 NOTE — Telephone Encounter (Signed)
-----   Message from Corwin LevinsJames W John, MD sent at 10/13/2015 12:45 PM EDT ----- Lourdes Sledgeorinne, was a1c lab addon sent?  It may be too late now

## 2015-10-13 NOTE — Telephone Encounter (Signed)
Patient is coming in for a lab draw of an A1c

## 2015-10-13 NOTE — Telephone Encounter (Signed)
Lab placed for Hgb A1c

## 2015-10-15 ENCOUNTER — Other Ambulatory Visit (INDEPENDENT_AMBULATORY_CARE_PROVIDER_SITE_OTHER): Payer: BC Managed Care – PPO

## 2015-10-15 DIAGNOSIS — E109 Type 1 diabetes mellitus without complications: Secondary | ICD-10-CM | POA: Diagnosis not present

## 2015-10-15 LAB — HEMOGLOBIN A1C: Hgb A1c MFr Bld: 4.6 % (ref 4.6–6.5)

## 2015-10-25 ENCOUNTER — Other Ambulatory Visit: Payer: Self-pay | Admitting: Internal Medicine

## 2015-11-22 ENCOUNTER — Other Ambulatory Visit: Payer: Self-pay | Admitting: Internal Medicine

## 2015-11-30 ENCOUNTER — Ambulatory Visit (INDEPENDENT_AMBULATORY_CARE_PROVIDER_SITE_OTHER)
Admission: RE | Admit: 2015-11-30 | Discharge: 2015-11-30 | Disposition: A | Discharge status: Home / Self Care | Payer: BC Managed Care – PPO | Source: Ambulatory Visit | Attending: Nurse Practitioner | Admitting: Nurse Practitioner

## 2015-11-30 ENCOUNTER — Encounter: Payer: Self-pay | Admitting: Nurse Practitioner

## 2015-11-30 ENCOUNTER — Ambulatory Visit (INDEPENDENT_AMBULATORY_CARE_PROVIDER_SITE_OTHER): Payer: BC Managed Care – PPO | Admitting: Nurse Practitioner

## 2015-11-30 ENCOUNTER — Other Ambulatory Visit: Payer: Self-pay | Admitting: *Deleted

## 2015-11-30 VITALS — BP 122/90 | HR 86 | Temp 98.0°F | Ht 72.0 in | Wt 220.0 lb

## 2015-11-30 DIAGNOSIS — J324 Chronic pansinusitis: Secondary | ICD-10-CM

## 2015-11-30 DIAGNOSIS — J309 Allergic rhinitis, unspecified: Secondary | ICD-10-CM

## 2015-11-30 MED ORDER — PROMETHAZINE-DM 6.25-15 MG/5ML PO SYRP: 5.0000 mL | ORAL_SOLUTION | Freq: Three times a day (TID) | ORAL | 0 refills | Status: AC | PRN

## 2015-11-30 MED ORDER — AMOXICILLIN-POT CLAVULANATE 875-125 MG PO TABS: 1.0000 | ORAL_TABLET | Freq: Two times a day (BID) | ORAL | 0 refills | Status: AC

## 2015-11-30 MED ORDER — SALINE SPRAY 0.65 % NA SOLN: 1.0000 | NASAL | 0 refills | Status: AC | PRN

## 2015-11-30 MED ORDER — PREDNISONE 10 MG (21) PO TBPK: 10.0000 mg | ORAL_TABLET | Freq: Every day | ORAL | 0 refills | Status: AC

## 2015-11-30 MED ORDER — GUAIFENESIN ER 600 MG PO TB12: 600.0000 mg | ORAL_TABLET | Freq: Two times a day (BID) | ORAL | 0 refills | Status: AC | PRN

## 2015-11-30 NOTE — Progress Notes (Signed)
Pre visit review using our clinic review tool, if applicable. No additional management support is needed unless otherwise documented below in the visit note. 

## 2015-11-30 NOTE — Progress Notes (Signed)
abnormal, Inform patient: X-ray indicates left maxillary sinusitis. We'll prescribe additional antibiotics. Complete antibiotics and prednisone course as prescribed. Follow-up with ENT when scheduled.

## 2015-11-30 NOTE — Progress Notes (Signed)
Subjective:  Patient ID: Matthew Griffin, male    DOB: 08-Aug-1975  Age: 40 y.o. MRN: [id number]  CC: Sinus Problem (Pt stated sinus infection, teeth/gum, bad cough at night for 2 weeks.)   Sinus Problem  This is a recurrent problem. The current episode started more than 1 month ago. The problem has been waxing and waning since onset. There has been no fever. His pain is at a severity of 6/10. The pain is moderate. Associated symptoms include congestion, coughing, headaches, sinus pressure and a sore throat. Pertinent negatives include no chills, ear pain, hoarse voice, shortness of breath or swollen glands. Past treatments include oral decongestants, saline sprays, antibiotics and acetaminophen. The treatment provided mild relief.    Outpatient Medications Prior to Visit  Medication Sig Dispense Refill  . albuterol (PROVENTIL HFA;VENTOLIN HFA) 108 (90 Base) MCG/ACT inhaler Inhale 2 puffs into the lungs every 6 (six) hours as needed for wheezing or shortness of breath. 1 Inhaler 1  . B Complex Vitamins (VITAMIN B COMPLEX PO) Take 1,000 mg by mouth.    . busPIRone (BUSPAR) 30 MG tablet     . cholecalciferol (VITAMIN D) 400 UNITS TABS tablet Take 400 Units by mouth.    . clonazePAM (KLONOPIN) 0.5 MG tablet Take 0.25 mg by mouth daily. In the morning    . esomeprazole (NEXIUM) 20 MG packet Take 22.3 mg by mouth daily before breakfast.    . fluticasone (FLONASE) 50 MCG/ACT nasal spray SHAKE LIQUID AND USE 2 SPRAYS IN EACH NOSTRIL TWICE DAILY 16 g 2  . gabapentin (NEURONTIN) 400 MG capsule     . Krill Oil (HM MEGAKRILL) 300 MG CAPS Take 500 mg by mouth.    . levothyroxine (SYNTHROID, LEVOTHROID) 75 MCG tablet Take 75 mcg by mouth daily before breakfast.    . LITHIUM CARBONATE ER PO Take 300 mg by mouth. 2 tablets in the morning, 3 tablets at night    . LORazepam (ATIVAN) 0.5 MG tablet Take 1 mg by mouth at bedtime.    Marland Kitchen lurasidone (LATUDA) 40 MG TABS tablet Take 60 mg by mouth at bedtime.    .  methocarbamol (ROBAXIN) 750 MG tablet TK 1 T PO Q 6-8 H PRF SPASM  1  . metoprolol succinate (TOPROL-XL) 25 MG 24 hr tablet TAKE 1 TABLET BY MOUTH EVERY DAY 90 tablet 1  . Multiple Vitamin (MULTIVITAMIN) capsule Take 1 capsule by mouth daily.    . Multiple Vitamins-Minerals (MULTIVITAMIN & MINERAL PO) Take by mouth.    . oxyCODONE-acetaminophen (PERCOCET/ROXICET) 5-325 MG tablet TK 1 TO 2 TS PO Q 4-6 H PRF PAIN  0  . simethicone (MYLICON) 80 MG chewable tablet Chew 80 mg by mouth.    . traMADol (ULTRAM) 50 MG tablet     . VIIBRYD 40 MG TABS     . vitamin B-12 (CYANOCOBALAMIN) 1000 MCG tablet Take 3,000 mcg by mouth daily.    Marland Kitchen azithromycin (ZITHROMAX) 250 MG tablet ZPK  1  . HYDROcodone-homatropine (HYCODAN) 5-1.5 MG/5ML syrup Take 5 mLs by mouth every 6 (six) hours as needed for cough. 180 mL 0  . predniSONE (DELTASONE) 10 MG tablet 3 tabs by mouth per day for 3 days,2tabs per day for 3 days,1tab per day for 3 days, then stop 18 tablet 0   No facility-administered medications prior to visit.     ROS See HPI  Objective:  BP 122/90 (BP Location: Left Arm, Patient Position: Sitting, Cuff Size: Normal)   Pulse 86  Temp 98 F (36.7 C)   Ht 6' (1.829 m)   Wt 220 lb (99.8 kg)   SpO2 99%   BMI 29.84 kg/m   BP Readings from Last 3 Encounters:  11/30/15 122/90  10/05/15 136/80  09/22/15 120/82    Wt Readings from Last 3 Encounters:  11/30/15 220 lb (99.8 kg)  10/05/15 224 lb (101.6 kg)  09/22/15 220 lb (99.8 kg)    Physical Exam  Constitutional: He is oriented to person, place, and time. No distress.  HENT:  Right Ear: Tympanic membrane, external ear and ear canal normal.  Left Ear: Tympanic membrane, external ear and ear canal normal.  Nose: Mucosal edema and rhinorrhea present. Right sinus exhibits maxillary sinus tenderness and frontal sinus tenderness. Left sinus exhibits maxillary sinus tenderness and frontal sinus tenderness.  Mouth/Throat: Posterior oropharyngeal  erythema present. No oropharyngeal exudate.  Eyes: No scleral icterus.  Neck: Normal range of motion.  Cardiovascular: Normal rate, regular rhythm and normal heart sounds.   Pulmonary/Chest: Effort normal and breath sounds normal.  Musculoskeletal: Normal range of motion. He exhibits no edema.  Lymphadenopathy:    He has no cervical adenopathy.  Neurological: He is alert and oriented to person, place, and time.  Skin: Skin is warm and dry.  Vitals reviewed.   Lab Results  Component Value Date   WBC 12.9 (H) 10/11/2015   HGB 15.5 10/11/2015   HCT 45.4 10/11/2015   PLT 250.0 10/11/2015   GLUCOSE 150 (H) 10/11/2015   ALT 19 10/11/2015   AST 8 10/11/2015   NA 138 10/11/2015   K 4.2 10/11/2015   CL 101 10/11/2015   CREATININE 1.38 10/11/2015   BUN 13 10/11/2015   CO2 28 10/11/2015   TSH 0.63 10/11/2015   HGBA1C 4.6 10/15/2015   Sinus DG indicates left maxillary sinus opacity.  Assessment & Plan:   Matthew Griffin was seen today for sinus problem.  Diagnoses and all orders for this visit:  Chronic pansinusitis -     promethazine-dextromethorphan (PROMETHAZINE-DM) 6.25-15 MG/5ML syrup; Take 5 mLs by mouth 3 (three) times daily as needed for cough. -     predniSONE (STERAPRED UNI-PAK 21 TAB) 10 MG (21) TBPK tablet; Take 1 tablet (10 mg total) by mouth daily. Take as directed on package. -     Ambulatory referral to ENT -     sodium chloride (OCEAN) 0.65 % SOLN nasal spray; Place 1 spray into both nostrils as needed for congestion. -     guaiFENesin (MUCINEX) 600 MG 12 hr tablet; Take 1 tablet (600 mg total) by mouth 2 (two) times daily as needed for cough or to loosen phlegm. -     DG Sinuses Complete; Future  Allergic rhinitis, unspecified allergic rhinitis type -     promethazine-dextromethorphan (PROMETHAZINE-DM) 6.25-15 MG/5ML syrup; Take 5 mLs by mouth 3 (three) times daily as needed for cough. -     predniSONE (STERAPRED UNI-PAK 21 TAB) 10 MG (21) TBPK tablet; Take 1 tablet (10  mg total) by mouth daily. Take as directed on package. -     Ambulatory referral to ENT -     sodium chloride (OCEAN) 0.65 % SOLN nasal spray; Place 1 spray into both nostrils as needed for congestion. -     guaiFENesin (MUCINEX) 600 MG 12 hr tablet; Take 1 tablet (600 mg total) by mouth 2 (two) times daily as needed for cough or to loosen phlegm.   I have discontinued Mr. Vanduyn's predniSONE, HYDROcodone-homatropine, and azithromycin. I  am also having him start on promethazine-dextromethorphan, predniSONE, sodium chloride, and guaiFENesin. Additionally, I am having him maintain his multivitamin, levothyroxine, clonazePAM, esomeprazole, vitamin B-12, cholecalciferol, LITHIUM CARBONATE ER PO, HM MEGAKRILL, B Complex Vitamins (VITAMIN B COMPLEX PO), LORazepam, lurasidone, albuterol, metoprolol succinate, fluticasone, simethicone, busPIRone, gabapentin, methocarbamol, oxyCODONE-acetaminophen, VIIBRYD, traMADol, and Multiple Vitamins-Minerals (MULTIVITAMIN & MINERAL PO).  Meds ordered this encounter  Medications  . promethazine-dextromethorphan (PROMETHAZINE-DM) 6.25-15 MG/5ML syrup    Sig: Take 5 mLs by mouth 3 (three) times daily as needed for cough.    Dispense:  240 mL    Refill:  0    Order Specific Question:   Supervising Provider    Answer:   Tresa GarterPLOTNIKOV, ALEKSEI V [[id number]]  . predniSONE (STERAPRED UNI-PAK 21 TAB) 10 MG (21) TBPK tablet    Sig: Take 1 tablet (10 mg total) by mouth daily. Take as directed on package.    Dispense:  21 tablet    Refill:  0    Order Specific Question:   Supervising Provider    Answer:   Tresa GarterPLOTNIKOV, ALEKSEI V [[id number]]  . sodium chloride (OCEAN) 0.65 % SOLN nasal spray    Sig: Place 1 spray into both nostrils as needed for congestion.    Dispense:  15 mL    Refill:  0    Order Specific Question:   Supervising Provider    Answer:   Tresa GarterPLOTNIKOV, ALEKSEI V [[id number]]  . guaiFENesin (MUCINEX) 600 MG 12 hr tablet    Sig: Take 1 tablet (600 mg total) by mouth 2 (two) times  daily as needed for cough or to loosen phlegm.    Dispense:  14 tablet    Refill:  0    Order Specific Question:   Supervising Provider    Answer:   Tresa GarterPLOTNIKOV, ALEKSEI V [[id number]]    Follow-up: Return if symptoms worsen or fail to improve.  Alysia Pennaharlotte Krishawn Vanderweele, NP

## 2015-11-30 NOTE — Patient Instructions (Signed)
You will be contacted with appt to ENT due to recurrent cough and sinus congestion.

## 2016-02-08 ENCOUNTER — Other Ambulatory Visit: Payer: Self-pay | Admitting: Internal Medicine

## 2016-03-04 ENCOUNTER — Other Ambulatory Visit: Payer: Self-pay | Admitting: Internal Medicine

## 2016-03-25 ENCOUNTER — Other Ambulatory Visit: Payer: Self-pay | Admitting: Internal Medicine

## 2016-04-10 ENCOUNTER — Other Ambulatory Visit: Payer: Self-pay | Admitting: Internal Medicine

## 2016-04-19 ENCOUNTER — Other Ambulatory Visit: Payer: Self-pay | Admitting: Internal Medicine

## 2016-05-13 ENCOUNTER — Other Ambulatory Visit: Payer: Self-pay | Admitting: Internal Medicine

## 2017-07-08 IMAGING — DX DG CHEST 2V
2 series · 2 of 2 positions shown · non-contrast
Comparison: None.

CLINICAL DATA: Productive cough and shortness of breath for 1
month, initial encounter

EXAM:
CHEST  2 VIEW

[chest pa]
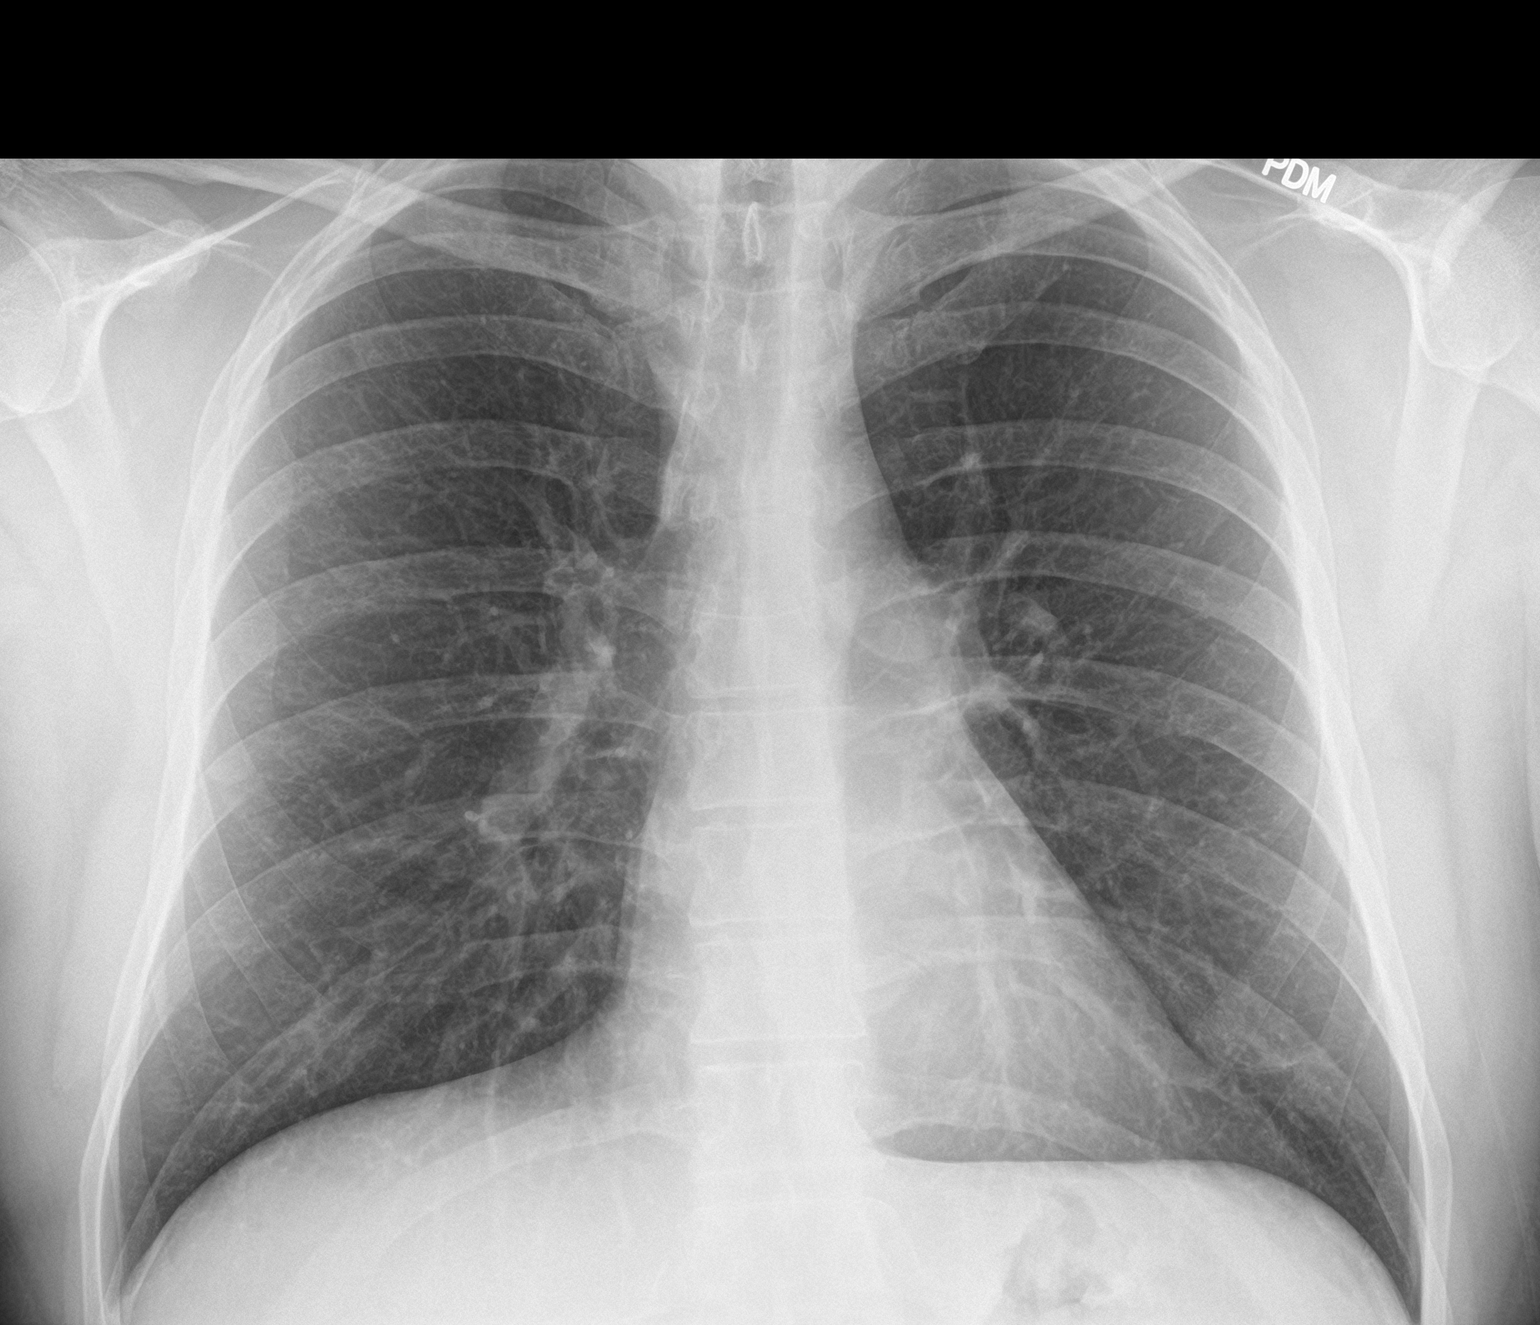

[chest lat]
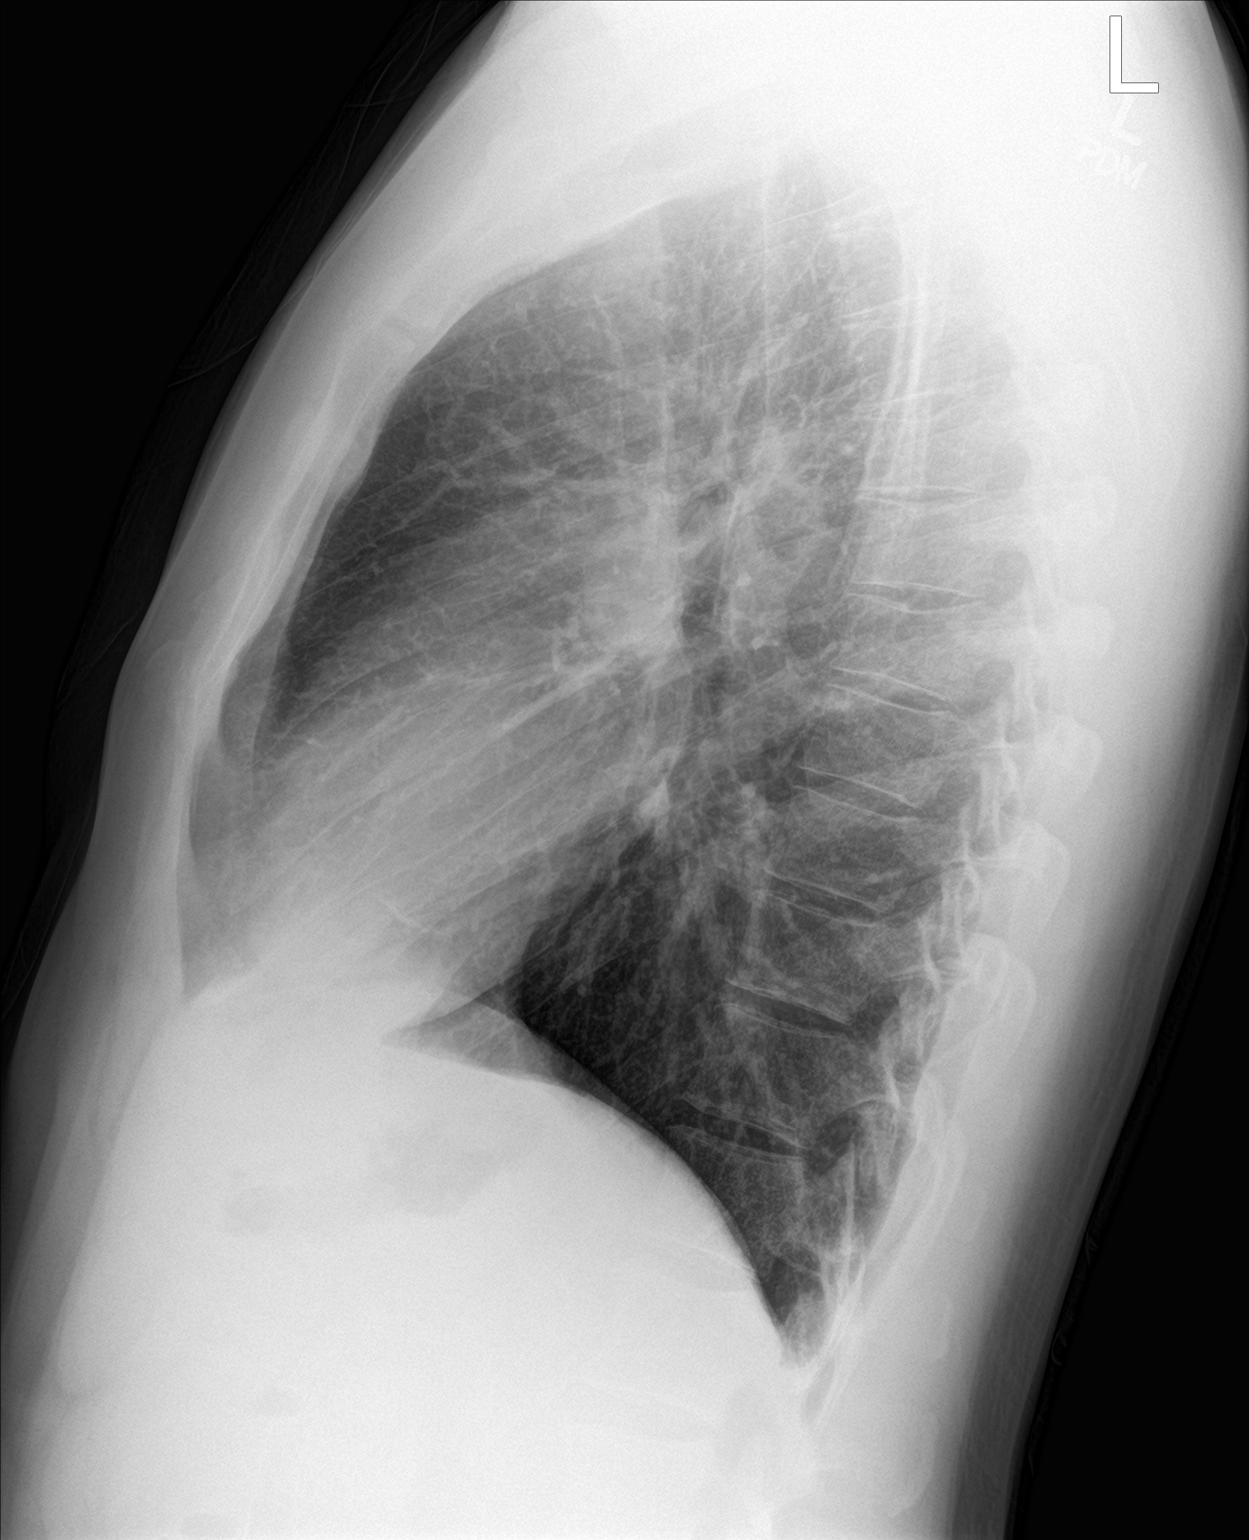

[2 of 2 positions shown; findings below may reference images not displayed]

FINDINGS: The heart size and mediastinal contours are within normal limits.
Both lungs are clear. The visualized skeletal structures are
unremarkable.
IMPRESSION: No active cardiopulmonary disease.

## 2017-08-27 IMAGING — DX DG SINUSES COMPLETE 3+V
4 series · 4 of 4 positions shown · non-contrast
Comparison: None.

CLINICAL DATA: Recent current sinus congestion with sinus pressure
and nasal discharge. Nocturnal cough.

EXAM:
PARANASAL SINUSES - COMPLETE 3 + VIEW

[waters]
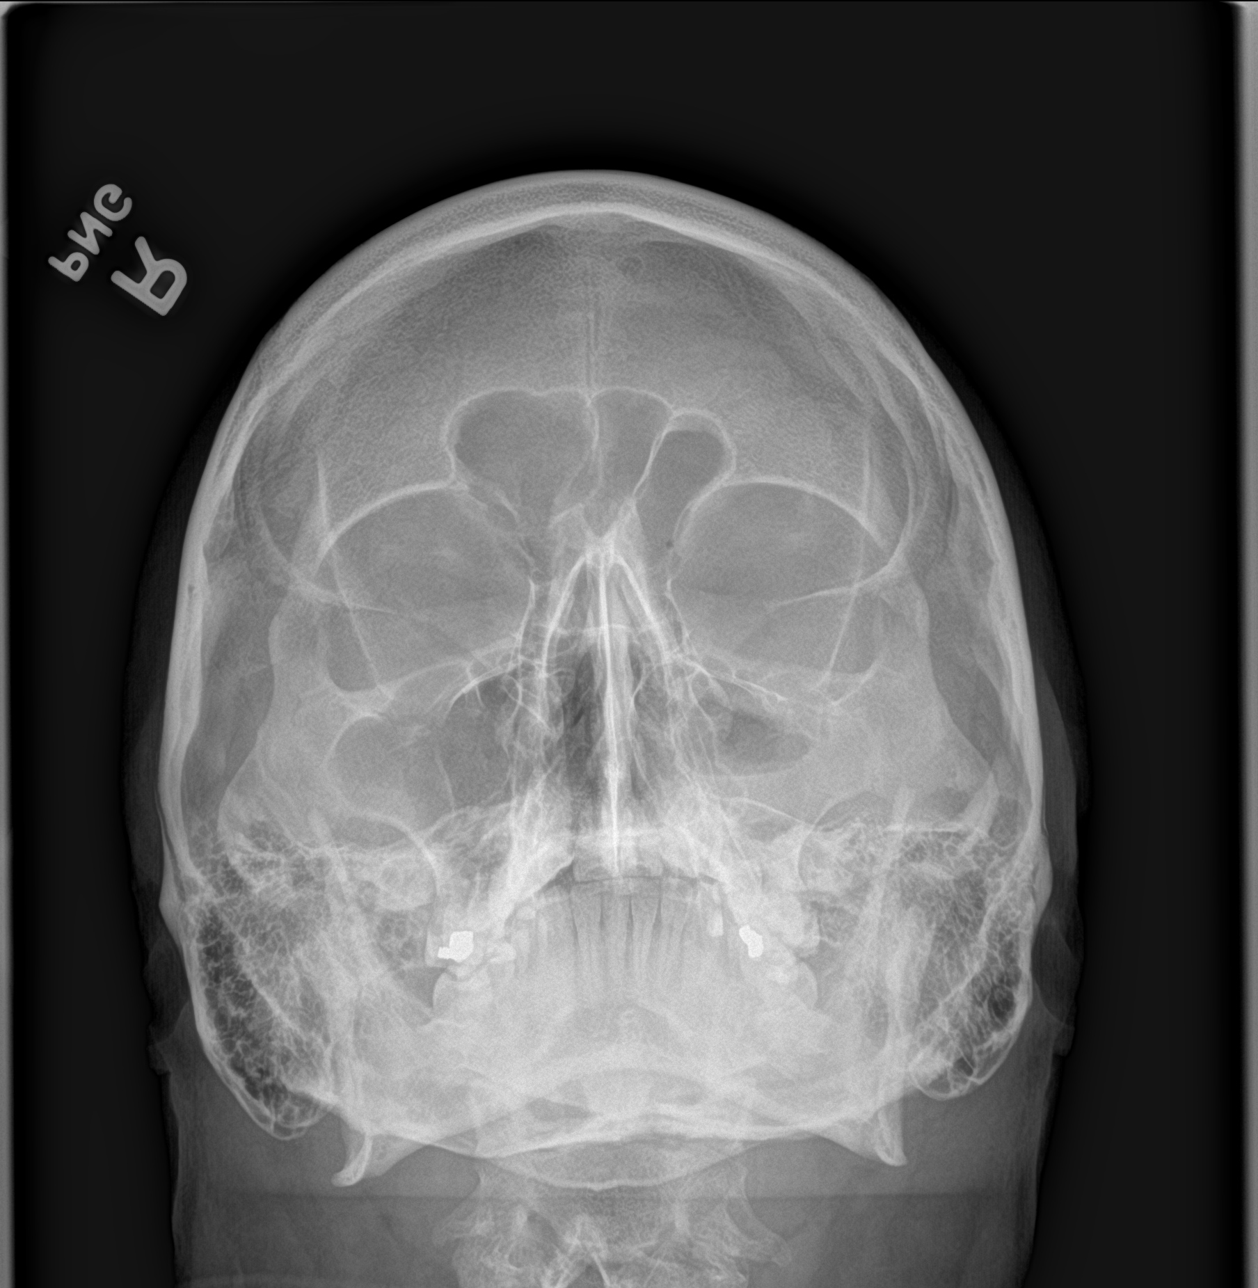

[[person_name]]
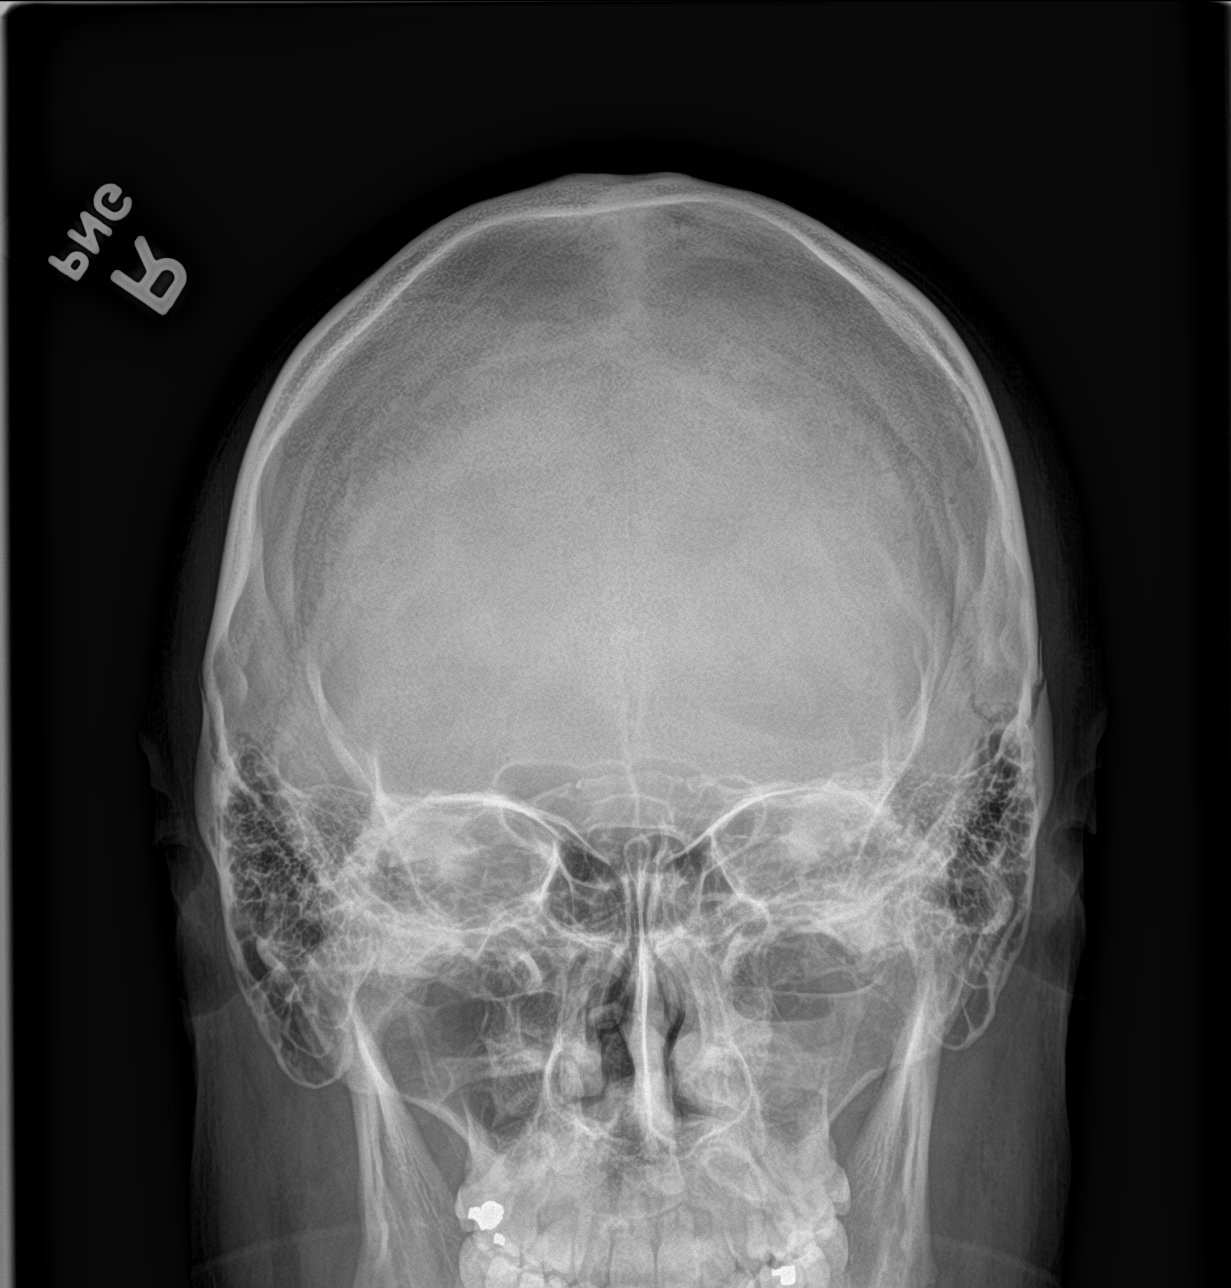

[lateral (1 of 2)]
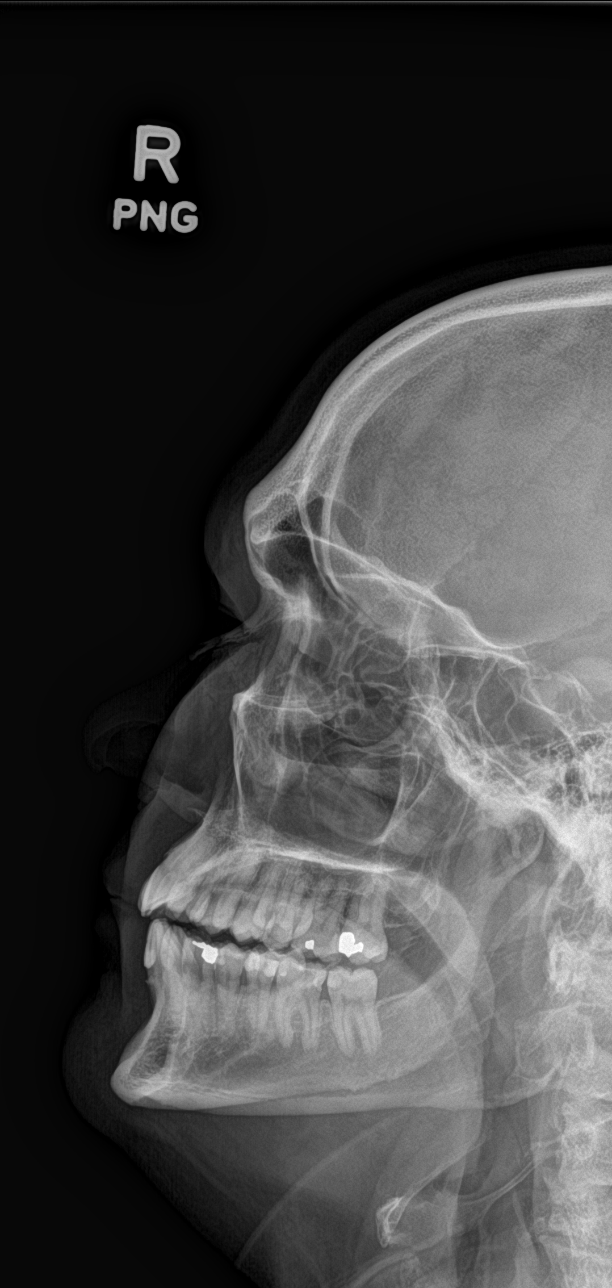

[lateral (2 of 2)]
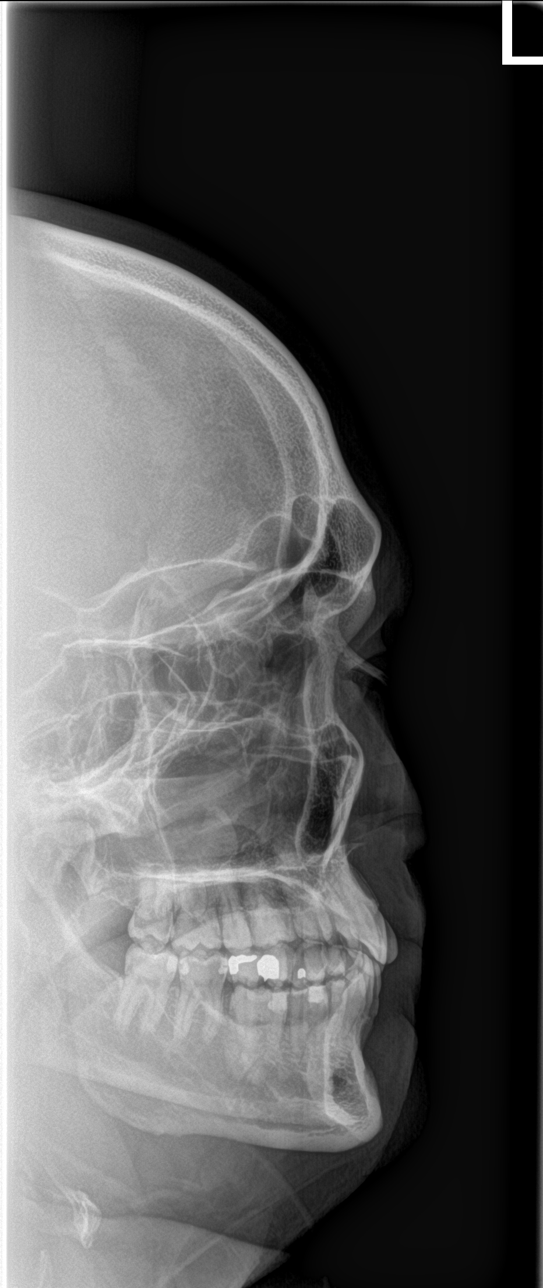

[4 of 4 positions shown; findings below may reference images not displayed]

FINDINGS: Gohal, Majboor lateral, and right lateral views obtained.
There is moderate opacification with an air-fluid level in the left
maxillary antrum. Other paranasal sinuses are clear. There is no
bony destruction or expansion. Mastoid air cells appear clear.
IMPRESSION: Evidence of acute sinusitis with moderate opacification and
air-fluid level in the left maxillary antrum. Other paranasal
sinuses clear. Mastoids are clear. No bony abnormality appreciable.

## 2022-04-12 ENCOUNTER — Emergency Department (HOSPITAL_COMMUNITY)
Admission: EM | Admit: 2022-04-12 | Discharge: 2022-04-12 | Disposition: A | Discharge status: Home / Self Care | Payer: BC Managed Care – PPO | Attending: Emergency Medicine | Admitting: Emergency Medicine

## 2022-04-12 ENCOUNTER — Encounter (HOSPITAL_COMMUNITY): Payer: Self-pay

## 2022-04-12 ENCOUNTER — Other Ambulatory Visit: Payer: Self-pay

## 2022-04-12 DIAGNOSIS — R55 Syncope and collapse: Secondary | ICD-10-CM | POA: Insufficient documentation

## 2022-04-12 DIAGNOSIS — Z5321 Procedure and treatment not carried out due to patient leaving prior to being seen by health care provider: Secondary | ICD-10-CM | POA: Diagnosis not present

## 2022-04-12 DIAGNOSIS — R0602 Shortness of breath: Secondary | ICD-10-CM | POA: Insufficient documentation

## 2022-04-12 LAB — CBC WITH DIFFERENTIAL/PLATELET
Abs Immature Granulocytes: 0.08 10*3/uL — ABNORMAL HIGH (ref 0.00–0.07)
Basophils Absolute: 0.1 10*3/uL (ref 0.0–0.1)
Basophils Relative: 1 %
Eosinophils Absolute: 0.2 10*3/uL (ref 0.0–0.5)
Eosinophils Relative: 2 %
HCT: 42.1 % (ref 39.0–52.0)
Hemoglobin: 14.8 g/dL (ref 13.0–17.0)
Immature Granulocytes: 1 %
Lymphocytes Relative: 27 %
Lymphs Abs: 2 10*3/uL (ref 0.7–4.0)
MCH: 30.2 pg (ref 26.0–34.0)
MCHC: 35.2 g/dL (ref 30.0–36.0)
MCV: 85.9 fL (ref 80.0–100.0)
Monocytes Absolute: 0.6 10*3/uL (ref 0.1–1.0)
Monocytes Relative: 8 %
Neutro Abs: 4.4 10*3/uL (ref 1.7–7.7)
Neutrophils Relative %: 61 %
Platelets: 365 10*3/uL (ref 150–400)
RBC: 4.9 MIL/uL (ref 4.22–5.81)
RDW: 12.2 % (ref 11.5–15.5)
WBC: 7.2 10*3/uL (ref 4.0–10.5)
nRBC: 0 % (ref 0.0–0.2)

## 2022-04-12 LAB — BASIC METABOLIC PANEL
Anion gap: 14 (ref 5–15)
BUN: 16 mg/dL (ref 6–20)
CO2: 23 mmol/L (ref 22–32)
Calcium: 10 mg/dL (ref 8.9–10.3)
Chloride: 101 mmol/L (ref 98–111)
Creatinine, Ser: 1.55 mg/dL — ABNORMAL HIGH (ref 0.61–1.24)
GFR, Estimated: 56 mL/min — ABNORMAL LOW (ref >60)
Glucose, Bld: 137 mg/dL — ABNORMAL HIGH (ref 70–99)
Potassium: 3.5 mmol/L (ref 3.5–5.1)
Sodium: 138 mmol/L (ref 135–145)

## 2022-04-12 LAB — TROPONIN I (HIGH SENSITIVITY)
Troponin I (High Sensitivity): 3 ng/L (ref <18)
Troponin I (High Sensitivity): 2 ng/L (ref <18)

## 2022-04-12 LAB — CBG MONITORING, ED: Glucose-Capillary: 132 mg/dL — ABNORMAL HIGH (ref 70–99)

## 2022-04-12 NOTE — ED Notes (Signed)
Pt called for vitals x3, not visualized in lobby or outside

## 2022-04-12 NOTE — ED Triage Notes (Signed)
Reports his head has been dizzy and was walking in the lobby and fell.  Reports he feels off balance.  Reports he has tunnel vision  +SOB. Mother is here in 4N due to being septic.  Patient reports he under a lot of stress. Denies CP

## 2022-04-12 NOTE — ED Provider Triage Note (Signed)
Emergency Medicine Provider Triage Evaluation Note  Matthew Griffin , a 47 y.o. male  was evaluated in triage.  Pt complains of near syncope.  Did not visiting his mother who is currently upstairs for a critical illness.  He is attended by a complete.  He states that he has been hyperventilating a lot over the past few days.  Today he felt like he was going to pass out.  .  Review of Systems  Positive: Hyperventilation, perioral paresthesia, numbness and tingling in the hands and feet Negative: loc  Physical Exam  BP (!) 147/104 (BP Location: Right Arm)   Pulse (!) 109   Temp 97.6 F (36.4 C)   Resp (!) 22   Ht 6' (1.829 m)   Wt 99.8 kg   SpO2 100%   BMI 29.84 kg/m  Gen:   Awake, no distress   Resp:  Hyperventilating MSK:   Moves extremities without difficulty  Other:  No nystagmus  Medical Decision Making  Medically screening exam initiated at 2:54 PM.  Appropriate orders placed.  Gorman Skeet was informed that the remainder of the evaluation will be completed by another provider, this initial triage assessment does not replace that evaluation, and the importance of remaining in the ED until their evaluation is complete.     Margarita Mail, PA-C 04/12/22 1456

## 2022-04-13 ENCOUNTER — Telehealth (HOSPITAL_COMMUNITY): Payer: Self-pay

## 2022-04-13 ENCOUNTER — Encounter (HOSPITAL_COMMUNITY): Payer: Self-pay | Admitting: *Deleted

## 2022-04-13 ENCOUNTER — Ambulatory Visit (HOSPITAL_COMMUNITY)
Admission: EM | Admit: 2022-04-13 | Discharge: 2022-04-13 | Disposition: A | Discharge status: Home / Self Care | Payer: BC Managed Care – PPO

## 2022-04-13 DIAGNOSIS — R42 Dizziness and giddiness: Secondary | ICD-10-CM

## 2022-04-13 HISTORY — DX: Methicillin resistant Staphylococcus aureus infection, unspecified site: A49.02

## 2022-04-13 MED ORDER — ONDANSETRON 4 MG PO TBDP: 4.0000 mg | ORAL_TABLET | Freq: Three times a day (TID) | ORAL | 0 refills | Status: DC | PRN

## 2022-04-13 MED ORDER — MECLIZINE HCL 12.5 MG PO TABS: 12.5000 mg | ORAL_TABLET | Freq: Three times a day (TID) | ORAL | 0 refills | Status: AC | PRN

## 2022-04-13 MED ORDER — MECLIZINE HCL 12.5 MG PO TABS: 12.5000 mg | ORAL_TABLET | Freq: Three times a day (TID) | ORAL | 0 refills | Status: DC | PRN

## 2022-04-13 MED ORDER — ONDANSETRON 4 MG PO TBDP: 4.0000 mg | ORAL_TABLET | Freq: Three times a day (TID) | ORAL | 0 refills | Status: AC | PRN

## 2022-04-13 NOTE — Telephone Encounter (Signed)
Patient calling in to update pharmacy that medications needed to be sent to from 04/13/22 visit. Patient confirmed that meds need to be sent to [facility].   Medications re-sent to correct pharmacy.

## 2022-04-13 NOTE — ED Provider Notes (Signed)
[place]    CSN: [id number] Arrival date & time: 04/13/22  1246      History   Chief Complaint Chief Complaint  Patient presents with   Dizziness    HPI Matthew Griffin is a 47 y.o. male.   Patient presents to urgent care for evaluation of dizziness, "swimmy headedness", and facial pressure to the left side that started 1 to 2 days ago.  Patient states he has had a difficult time ambulating without feeling as though he will fall over due to dizziness.  Dizziness is worse with position changes and with head movement.  Denies vision changes, numbness and tingling to the face, numbness and tingling to the bilateral upper or lower extremities, extremity weakness, history of CVA, anticoagulation use, recent falls/trauma/injury to the head, headache, abdominal pain, rash, neck pain, fever/chills, and lightheadedness.  He is not diabetic and denies drug use.  He states that when he moves his head forward, he feels as though "his brain is sloshing in his head".  No history of vertigo in the past.  Denies recent ear infections and tinnitus.  No ear pain.  He is drinking water normally and tolerating food and fluids without difficulty.  He does report a gastrointestinal illness approximately 5 to 6 days ago where he was experiencing multiple episodes of watery diarrhea without blood/mucus to the stool.  He is no longer experiencing diarrhea and never had any episodes of emesis.  He is not currently nauseous.  He does state that his mother is currently in the hospital and this is causing lots of stress in his life.  He does not live in [place] and is traveling here to visit his mother.Patient did go to the emergency department last night for symptoms but left without being seen.  Labs reviewed.  Creatinine slightly bumped at 1.55 with GFR of 56.  He has a history of lithium use for bipolar 1 disorder and states that he intermittently has bumps in his creatinine for which he usually increases his  fluid intake and kidney function returns to normal.  Bipolar disorder is managed by psychiatry.  No recent medication changes.  Patient had 2 negative troponins in the emergency department last night on chart review.  EKG was normal last night.    Dizziness   Past Medical History:  Diagnosis Date   Bipolar disorder ([place])    GERD (gastroesophageal reflux disease)    HLD (hyperlipidemia)    HTN (hypertension)    Hypothyroidism    Migraines    MRSA (methicillin resistant Staphylococcus aureus)     Patient Active Problem List   Diagnosis Date Noted   Cough 09/26/2015   Dyspnea 09/26/2015   Bipolar 1 disorder, mixed, moderate ([place]) 03/10/2015   Generalized anxiety disorder, severe 03/10/2015   Left shoulder pain 12/04/2014   Chronic fatigue 12/04/2014   Chronic mental illness 10/18/2014   History of thrush 10/18/2014   Hypothyroidism 10/18/2014   History of mononucleosis 10/18/2014   Essential hypertension 10/18/2014    Past Surgical History:  Procedure Laterality Date   ROTATOR CUFF REPAIR Left    X2   SEPTOPLASTY         Home Medications    Prior to Admission medications   Medication Sig Start Date End Date Taking? Authorizing Provider  amLODipine (NORVASC) 10 MG tablet  02/20/22  Yes [provider]  B Complex Vitamins (VITAMIN B COMPLEX PO) Take 1,000 mg by mouth.   Yes [provider]  cholecalciferol (VITAMIN  D) 400 UNITS TABS tablet Take 400 Units by mouth.   Yes [provider]  famotidine (PEPCID) 20 MG tablet  04/06/22  Yes [provider]  fenofibrate (TRICOR) 145 MG tablet  03/09/21  Yes [provider]  fluticasone (FLONASE) 50 MCG/ACT nasal spray Place 2 sprays into both nostrils daily. 04/20/16  Yes Pincus Sanes, MD  gabapentin (NEURONTIN) 400 MG capsule  11/21/15  Yes [provider]  hydrochlorothiazide (HYDRODIURIL) 50 MG tablet  04/06/22  Yes [provider]  Boris Lown Oil (HM MEGAKRILL) 300 MG  CAPS Take 500 mg by mouth.   Yes [provider]  levothyroxine (SYNTHROID, LEVOTHROID) 75 MCG tablet Take 75 mcg by mouth daily before breakfast.   Yes [provider]  meclizine (ANTIVERT) 12.5 MG tablet Take 1 tablet (12.5 mg total) by mouth 3 (three) times daily as needed for dizziness. 04/13/22  Yes Carlisle Beers, FNP  Multiple Vitamin (MULTIVITAMIN) capsule Take 1 capsule by mouth daily.   Yes [provider]  ondansetron (ZOFRAN-ODT) 4 MG disintegrating tablet Take 1 tablet (4 mg total) by mouth every 8 (eight) hours as needed for nausea or vomiting. 04/13/22  Yes Carlisle Beers, FNP  QUEtiapine (SEROQUEL) 300 MG tablet  03/07/19  Yes [provider]  TRULICITY 4.5 MG/0.5ML SOPN INJECT 4.5 MG SUBCUTANEOUS EVERY 7 DAYS 11/21/21  Yes [provider]  VIIBRYD 40 MG TABS  11/30/15  Yes [provider]  vitamin B-12 (CYANOCOBALAMIN) 1000 MCG tablet Take 3,000 mcg by mouth daily.   Yes [provider]  albuterol (PROVENTIL HFA;VENTOLIN HFA) 108 (90 Base) MCG/ACT inhaler Inhale 2 puffs into the lungs every 6 (six) hours as needed for wheezing or shortness of breath. 09/22/15   Corwin Levins, MD  amoxicillin-clavulanate (AUGMENTIN) 875-125 MG tablet Take 1 tablet by mouth 2 (two) times daily. 11/30/15   Nche, Bonna Gains, NP  busPIRone (BUSPAR) 30 MG tablet  09/27/15   [provider]  clonazePAM (KLONOPIN) 0.5 MG tablet Take 0.25 mg by mouth daily. In the morning    [provider]  esomeprazole (NEXIUM) 20 MG packet Take 22.3 mg by mouth daily before breakfast.    [provider]  guaiFENesin (MUCINEX) 600 MG 12 hr tablet Take 1 tablet (600 mg total) by mouth 2 (two) times daily as needed for cough or to loosen phlegm. 11/30/15   Nche, Bonna Gains, NP  LITHIUM CARBONATE ER PO Take 300 mg by mouth. 2 tablets in the morning, 3 tablets at night    [provider]  LORazepam (ATIVAN) 0.5 MG tablet  Take 1 mg by mouth at bedtime.    [provider]  lurasidone (LATUDA) 40 MG TABS tablet Take 60 mg by mouth at bedtime.    [provider]  methocarbamol (ROBAXIN) 750 MG tablet TK 1 T PO Q 6-8 H PRF SPASM 09/20/15   [provider]  metoprolol succinate (TOPROL-XL) 25 MG 24 hr tablet Take 1 tablet (25 mg total) by mouth daily. --- Office visit needed for further refills 04/11/16   Pincus Sanes, MD  Multiple Vitamins-Minerals (MULTIVITAMIN & MINERAL PO) Take by mouth.    [provider]  oxyCODONE-acetaminophen (PERCOCET/ROXICET) 5-325 MG tablet TK 1 TO 2 TS PO Q 4-6 H PRF PAIN 09/20/15   [provider]  predniSONE (STERAPRED UNI-PAK 21 TAB) 10 MG (21) TBPK tablet Take 1 tablet (10 mg total) by mouth daily. Take as directed on package. 11/30/15  Nche, Bonna Gains, NP  promethazine-dextromethorphan (PROMETHAZINE-DM) 6.25-15 MG/5ML syrup Take 5 mLs by mouth 3 (three) times daily as needed for cough. 11/30/15   Nche, Bonna Gains, NP  simethicone (MYLICON) 80 MG chewable tablet Chew 80 mg by mouth.    [provider]  sodium chloride (OCEAN) 0.65 % SOLN nasal spray Place 1 spray into both nostrils as needed for congestion. 11/30/15   Nche, Bonna Gains, NP  traMADol Janean Sark) 50 MG tablet  11/23/15   [provider]    Family History Family History  Problem Relation Age of Onset   Heart disease Father    Hypertension Father    Stroke Maternal Grandmother    Heart disease Paternal Grandmother    Hypertension Paternal Grandmother    Mental illness Paternal Grandmother    Heart disease Paternal Grandfather    Hypertension Paternal Grandfather     Social History Social History   Tobacco Use   Smoking status: Never   Smokeless tobacco: Never  Vaping Use   Vaping Use: Never used  Substance Use Topics   Alcohol use: Yes    Alcohol/week: 1.0 - 2.0 standard drink of alcohol    Types: 1 - 2 Standard drinks or equivalent per week    Drug use: No     Allergies   Oxycodone, Dust mite extract, and Molds & smuts   Review of Systems Review of Systems  Neurological:  Positive for dizziness.  Per HPI   Physical Exam Triage Vital Signs ED Triage Vitals  Enc Vitals Group     BP 04/13/22 1346 (!) 152/101     Pulse Rate 04/13/22 1346 96     Resp 04/13/22 1346 20     Temp 04/13/22 1346 98.1 F (36.7 C)     Temp Source 04/13/22 1346 Oral     SpO2 04/13/22 1346 99 %     Weight --      Height --      Head Circumference --      Peak Flow --      Pain Score 04/13/22 1337 0     Pain Loc --      Pain Edu? --      Excl. in GC? --    Orthostatic VS for the past 24 hrs:  BP- Lying Pulse- Lying BP- Sitting Pulse- Sitting BP- Standing at 0 minutes Pulse- Standing at 0 minutes  04/13/22 1351 (!) 151/97 101 (!) 170/97 106 (!) 154/93 108    Updated Vital Signs BP (!) 152/101 (BP Location: Right Arm)   Pulse 96   Temp 98.1 F (36.7 C) (Oral)   Resp 20   SpO2 99%   Visual Acuity Right Eye Distance:   Left Eye Distance:   Bilateral Distance:    Right Eye Near:   Left Eye Near:    Bilateral Near:     Physical Exam Vitals and nursing note reviewed.  Constitutional:      Appearance: He is not ill-appearing or toxic-appearing.  HENT:     Head: Normocephalic and atraumatic.     Right Ear: Hearing, tympanic membrane, ear canal and external ear normal.     Left Ear: Hearing, tympanic membrane, ear canal and external ear normal.     Nose: Nose normal.     Mouth/Throat:     Lips: Pink.  Eyes:     General: Lids are normal. Vision grossly intact. Gaze aligned appropriately. No visual field deficit.    Extraocular Movements: Extraocular movements intact.  Conjunctiva/sclera: Conjunctivae normal.  Cardiovascular:     Rate and Rhythm: Normal rate and regular rhythm.     Heart sounds: Normal heart sounds, S1 normal and S2 normal.  Pulmonary:     Effort: Pulmonary effort is normal. No respiratory distress.      Breath sounds: Normal breath sounds and air entry.  Musculoskeletal:     Cervical back: Neck supple.  Skin:    General: Skin is warm and dry.     Capillary Refill: Capillary refill takes less than 2 seconds.     Findings: No rash.  Neurological:     General: No focal deficit present.     Mental Status: He is alert and oriented to person, place, and time. Mental status is at baseline.     Cranial Nerves: Cranial nerves 2-12 are intact. No dysarthria or facial asymmetry.     Sensory: Sensation is intact.     Motor: Motor function is intact.     Coordination: Coordination is intact. Romberg sign negative. Coordination normal.     Gait: Gait is intact.     Comments: Nonfocal neurologic exam.  Cranial nerves intact.  Mentating at baseline.  Psychiatric:        Mood and Affect: Mood normal.        Speech: Speech normal.        Behavior: Behavior normal.        Thought Content: Thought content normal.        Judgment: Judgment normal.      UC Treatments / Results  Labs (all labs ordered are listed, but only abnormal results are displayed) Labs Reviewed - No data to display  EKG   Radiology No results found.  Procedures Procedures (including critical care time)  Medications Ordered in UC Medications - No data to display  Initial Impression / Assessment and Plan / UC Course  I have reviewed the triage vital signs and the nursing notes.  Pertinent labs & imaging results that were available during my care of the patient were reviewed by me and considered in my medical decision making (see chart for details).   1.  Vertigo Presentation is consistent with vertigo etiology.  Epley maneuvers provided as well as meclizine 12.5 to be taken as directed.  He is tolerating food and fluids well without nausea, vomiting, or abdominal pain, therefore deferred IV fluid administration.  Orthostatic vital signs are negative.  Low suspicion for acute dehydration.  His neurologic exam is  stable.  Strict ER return precautions discussed.  Drowsiness precautions discussed regarding meclizine use.   Discussed physical exam and available lab work findings in clinic with patient.  Counseled patient regarding appropriate use of medications and potential side effects for all medications recommended or prescribed today. Discussed red flag signs and symptoms of worsening condition,when to call the PCP office, return to urgent care, and when to seek higher level of care in the emergency department. Patient verbalizes understanding and agreement with plan. All questions answered. Patient discharged in stable condition.    Final Clinical Impressions(s) / UC Diagnoses   Final diagnoses:  Vertigo     Discharge Instructions      You have been evaluated today for dizziness. Your evaluation suggests that your symptoms are most likely due to vertigo.  You have been prescribed meclizine to help relieve your symptoms. This medicine can make you sleepy, so do not take this and drive, drink alcohol, or go to work. Please take your prescription as directed. You  can also try Epley Maneuvers at home to help relieve your symptoms- you were given a print out of instructions on how to perform these at home.  Please follow up with your primary care provider for further management. Drink plenty of water to stay well hydrated.  If your symptoms do not improve in the next 2-3 days with interventions, please return. Return immediately for worsening or uncontrolled symptoms, worsening headache, chest pain, shortness of breath, persistent vomiting, vision changes, fainting, or for any other concerning symptoms.  I hope you feel better!      ED Prescriptions     Medication Sig Dispense Auth. Provider   ondansetron (ZOFRAN-ODT) 4 MG disintegrating tablet Take 1 tablet (4 mg total) by mouth every 8 (eight) hours as needed for nausea or vomiting. 20 tablet Joella Prince M, FNP   meclizine (ANTIVERT)  12.5 MG tablet Take 1 tablet (12.5 mg total) by mouth 3 (three) times daily as needed for dizziness. 30 tablet Talbot Grumbling, FNP      PDMP not reviewed this encounter.   Talbot Grumbling, [place] 04/13/22 1510

## 2022-04-13 NOTE — Discharge Instructions (Signed)
You have been evaluated today for dizziness. Your evaluation suggests that your symptoms are most likely due to vertigo.  You have been prescribed meclizine to help relieve your symptoms. This medicine can make you sleepy, so do not take this and drive, drink alcohol, or go to work. Please take your prescription as directed. You can also try Epley Maneuvers at home to help relieve your symptoms- you were given a print out of instructions on how to perform these at home.  Please follow up with your primary care provider for further management. Drink plenty of water to stay well hydrated.  If your symptoms do not improve in the next 2-3 days with interventions, please return. Return immediately for worsening or uncontrolled symptoms, worsening headache, chest pain, shortness of breath, persistent vomiting, vision changes, fainting, or for any other concerning symptoms.  I hope you feel better!

## 2022-04-13 NOTE — ED Triage Notes (Signed)
Pt states he has been having dizziness and has fallen a few times. He went to ER yesterday and left due to wait. He is having a hard time gathering his thoughts and speaking. He reports the "weird sensation" is more on the left side. He said he feels like his brain is floating. He states he does have tingling in his face and lip he also states his lip is droopy from time to time. He is active and he states he is not worried about his heart but he is very SOB. His mom is in the hospital currently and he doesn't live locally.
